# Patient Record
Sex: Female | Born: 1935 | Race: White | Hispanic: No | Marital: Married | Smoking: Never smoker
Health system: Southern US, Community
[De-identification: ages and names within clinical notes are randomized; demographics above are authoritative.]

## PROBLEM LIST (undated history)

## (undated) DIAGNOSIS — C801 Malignant (primary) neoplasm, unspecified: Secondary | ICD-10-CM

## (undated) DIAGNOSIS — R000 Tachycardia, unspecified: Secondary | ICD-10-CM

## (undated) HISTORY — PX: CHOLECYSTECTOMY: SHX55

## (undated) HISTORY — PX: ABDOMINAL HYSTERECTOMY: SHX81

## (undated) HISTORY — PX: BREAST SURGERY: SHX581

---

## 1998-02-11 ENCOUNTER — Ambulatory Visit (HOSPITAL_COMMUNITY): Admission: RE | Admit: 1998-02-11 | Discharge: 1998-02-11 | Payer: Self-pay | Admitting: Internal Medicine

## 2005-05-30 ENCOUNTER — Ambulatory Visit: Payer: Self-pay

## 2005-08-18 ENCOUNTER — Ambulatory Visit: Payer: Self-pay | Admitting: Unknown Physician Specialty

## 2005-12-20 ENCOUNTER — Ambulatory Visit: Payer: Self-pay | Admitting: Otolaryngology

## 2007-12-30 ENCOUNTER — Ambulatory Visit: Payer: Self-pay | Admitting: Podiatry

## 2008-01-09 ENCOUNTER — Other Ambulatory Visit: Payer: Self-pay

## 2008-01-09 ENCOUNTER — Ambulatory Visit: Payer: Self-pay | Admitting: Podiatry

## 2008-01-15 ENCOUNTER — Ambulatory Visit: Payer: Self-pay | Admitting: Podiatry

## 2008-05-20 ENCOUNTER — Ambulatory Visit: Payer: Self-pay | Admitting: Unknown Physician Specialty

## 2009-09-20 ENCOUNTER — Emergency Department: Payer: Self-pay | Admitting: Emergency Medicine

## 2009-10-25 ENCOUNTER — Ambulatory Visit: Payer: Self-pay | Admitting: Gastroenterology

## 2011-04-26 DIAGNOSIS — F419 Anxiety disorder, unspecified: Secondary | ICD-10-CM | POA: Diagnosis present

## 2011-04-26 DIAGNOSIS — E039 Hypothyroidism, unspecified: Secondary | ICD-10-CM | POA: Diagnosis present

## 2011-04-26 DIAGNOSIS — K219 Gastro-esophageal reflux disease without esophagitis: Secondary | ICD-10-CM | POA: Diagnosis present

## 2012-04-03 ENCOUNTER — Ambulatory Visit: Payer: Self-pay | Admitting: Pain Medicine

## 2012-04-17 ENCOUNTER — Ambulatory Visit: Payer: Self-pay | Admitting: Pain Medicine

## 2012-05-06 ENCOUNTER — Ambulatory Visit: Payer: Self-pay | Admitting: Pain Medicine

## 2013-03-19 ENCOUNTER — Ambulatory Visit: Payer: Self-pay | Admitting: Family Medicine

## 2014-04-30 ENCOUNTER — Ambulatory Visit: Payer: Self-pay | Admitting: Otolaryngology

## 2014-06-05 ENCOUNTER — Emergency Department: Payer: Self-pay | Admitting: Emergency Medicine

## 2014-06-05 LAB — COMPREHENSIVE METABOLIC PANEL
ALBUMIN: 3.4 g/dL (ref 3.4–5.0)
ANION GAP: 5 — AB (ref 7–16)
AST: 27 U/L (ref 15–37)
Alkaline Phosphatase: 77 U/L
BUN: 12 mg/dL (ref 7–18)
Bilirubin,Total: 0.6 mg/dL (ref 0.2–1.0)
CHLORIDE: 107 mmol/L (ref 98–107)
CO2: 28 mmol/L (ref 21–32)
Calcium, Total: 8.5 mg/dL (ref 8.5–10.1)
Creatinine: 0.97 mg/dL (ref 0.60–1.30)
EGFR (African American): >60
GFR CALC NON AF AMER: 59 — AB
GLUCOSE: 108 mg/dL — AB (ref 65–99)
OSMOLALITY: 280 (ref 275–301)
Potassium: 4.6 mmol/L (ref 3.5–5.1)
SGPT (ALT): 18 U/L
SODIUM: 140 mmol/L (ref 136–145)
Total Protein: 6.5 g/dL (ref 6.4–8.2)

## 2014-06-05 LAB — CBC
HCT: 41.6 % (ref 35.0–47.0)
HGB: 13.6 g/dL (ref 12.0–16.0)
MCH: 30.8 pg (ref 26.0–34.0)
MCHC: 32.7 g/dL (ref 32.0–36.0)
MCV: 94 fL (ref 80–100)
PLATELETS: 250 10*3/uL (ref 150–440)
RBC: 4.42 10*6/uL (ref 3.80–5.20)
RDW: 12.9 % (ref 11.5–14.5)
WBC: 8.7 10*3/uL (ref 3.6–11.0)

## 2014-06-05 LAB — TROPONIN I

## 2014-06-29 ENCOUNTER — Ambulatory Visit: Payer: Self-pay | Admitting: Obstetrics and Gynecology

## 2017-08-27 ENCOUNTER — Encounter: Payer: Self-pay | Admitting: *Deleted

## 2017-08-27 ENCOUNTER — Ambulatory Visit
Admission: EM | Admit: 2017-08-27 | Discharge: 2017-08-27 | Disposition: A | Discharge status: Home / Self Care | Payer: Medicare HMO | Attending: Family Medicine | Admitting: Family Medicine

## 2017-08-27 DIAGNOSIS — H8149 Vertigo of central origin, unspecified ear: Secondary | ICD-10-CM | POA: Diagnosis not present

## 2017-08-27 DIAGNOSIS — J0101 Acute recurrent maxillary sinusitis: Secondary | ICD-10-CM

## 2017-08-27 DIAGNOSIS — R42 Dizziness and giddiness: Secondary | ICD-10-CM

## 2017-08-27 DIAGNOSIS — H65193 Other acute nonsuppurative otitis media, bilateral: Secondary | ICD-10-CM | POA: Diagnosis not present

## 2017-08-27 HISTORY — DX: Tachycardia, unspecified: R00.0

## 2017-08-27 HISTORY — DX: Malignant (primary) neoplasm, unspecified: C80.1

## 2017-08-27 MED ORDER — AMOXICILLIN-POT CLAVULANATE 875-125 MG PO TABS: 1.0000 | ORAL_TABLET | Freq: Two times a day (BID) | ORAL | 0 refills | Status: DC

## 2017-08-27 MED ORDER — ONDANSETRON 4 MG PO TBDP: 4.0000 mg | ORAL_TABLET | Freq: Three times a day (TID) | ORAL | 0 refills | Status: DC | PRN

## 2017-08-27 NOTE — ED Provider Notes (Signed)
[facility] ____________________________________________  Time seen: Approximately 11:04 AM  I have reviewed the triage vital signs and the nursing notes.   HISTORY  Chief Complaint Otalgia; Dizziness; Facial Pain; and Headache  HPI Judy Jenkins is a 83 y.o. female past medical history including recurrent sinusitis, multiple previous sinus surgeries, vertigo, breast cancer presenting with husband at bedside for evaluation of 1 week of nasal congestion, nasal drainage and intermittent vertigo over the last few days.  Patient reports has a history of vertigo, with worse occurring several years ago, states current vertigo is only present with position changes, and reports is mild compared to past vertigo episodes.  States has taken home meclizine this morning which has helped and she is feeling better in regards to intermittent dizziness sensation and her vertigo how she describes as room spinning for a few seconds after movements.  States that if she is sitting still she feels fine.  Denies issues when upright and ambulating.  Patient states that she has had increased nasal drainage, stating postnasal drainage that is thick yellow.  States that she uses a pulsating nasal irrigation twice a day and having copious greenish yellow drainage.  States increased sinus pressure, worse to left maxillary sinus area.  States that every 2-3 months this recurs.  States sometimes she is able to not take antibiotics and symptoms resolved.  States last episode was in mid November in which she took 2-3 days of minocycline which made her sick on her stomach, so she stopped.  States last week for 4 days she also took minocycline again and states that she has had more nausea with it.  No vomiting or diarrhea.  Continues with normal bowel movements.  States that she is very sensitive to almost all antibiotics.  Allergic only to codeine and sulfa.  States most antibiotics make her sick on her stomach.   Reports continues to eat and drink.  Denies associated fevers.  Denies unilateral weakness, syncope or near syncope, vision changes, weakness, vomiting or diarrhea.  Denies previous CVA, renal insufficiency or diabetes.  Denies chest pain, shortness of breath, abdominal pain, dysuria, extremity pain, extremity swelling or rash. Denies recent sickness. Denies recent antibiotic use.  States normally follows with Dr. Ladene Artist.  Also has a PCP appointment for this Friday for similar complaints.  Hortencia Pilar, MD: PCP   Past Medical History:  Diagnosis Date  . Cancer ([place])   . Tachycardia   GERD Breast Cancer Sinusitis  There are no active problems to display for this patient.   Past Surgical History:  Procedure Laterality Date  . ABDOMINAL HYSTERECTOMY    . BREAST SURGERY    . CHOLECYSTECTOMY    multiple sinus surgeries    No current facility-administered medications for this encounter.   Current Outpatient Medications:  .  ALPRAZolam (XANAX) 0.25 MG tablet, Take 0.25 mg by mouth at bedtime as needed for anxiety., Disp: , Rfl:  .  amitriptyline (ELAVIL) 10 MG tablet, Take 10 mg by mouth at bedtime., Disp: , Rfl:  .  esomeprazole (NEXIUM) 20 MG capsule, Take 20 mg by mouth daily at 12 noon., Disp: , Rfl:  .  levothyroxine (SYNTHROID, LEVOTHROID) 50 MCG tablet, Take 50 mcg by mouth daily before breakfast., Disp: , Rfl:  .  meclizine (ANTIVERT) 25 MG tablet, Take 25 mg by mouth 3 (three) times daily as needed for dizziness., Disp: , Rfl:  .  metoprolol succinate (TOPROL-XL) 25 MG 24 hr tablet, Take 25 mg by mouth  daily., Disp: , Rfl:  .  simvastatin (ZOCOR) 20 MG tablet, Take 20 mg by mouth daily., Disp: , Rfl:  .  amoxicillin-clavulanate (AUGMENTIN) 875-125 MG tablet, Take 1 tablet by mouth every 12 (twelve) hours., Disp: 20 tablet, Rfl: 0 .  ondansetron (ZOFRAN ODT) 4 MG disintegrating tablet, Take 1 tablet (4 mg total) by mouth every 8 (eight) hours as needed for nausea., Disp: 15  tablet, Rfl: 0  Allergies Codeine and Sulfa antibiotics  Family History  Problem Relation Age of Onset  . CAD Mother   . CAD Father     Social History Social History   Tobacco Use  . Smoking status: Never Smoker  . Smokeless tobacco: Never Used  Substance Use Topics  . Alcohol use: No    Frequency: Never  . Drug use: No    Review of Systems Constitutional: No fever/chills Eyes: No visual changes. ENT: No sore throat. Cardiovascular: Denies chest pain. Respiratory: Denies shortness of breath. Gastrointestinal: No abdominal pain. No vomiting.  No diarrhea.  No constipation. Musculoskeletal: Negative for back pain. Skin: Negative for rash. Neurological: Negative for focal weakness or numbness.   ____________________________________________   PHYSICAL EXAM:  VITAL SIGNS: ED Triage Vitals  Enc Vitals Group     BP 08/27/17 1039 129/61     Pulse Rate 08/27/17 1039 89     Resp 08/27/17 1039 16     Temp 08/27/17 1039 97.6 F (36.4 C)     Temp Source 08/27/17 1039 Oral     SpO2 08/27/17 1039 98 %     Weight 08/27/17 1041 130 lb (59 kg)     Height 08/27/17 1041 5\' 7"  (1.702 m)     Head Circumference --      Peak Flow --      Pain Score 08/27/17 1041 6     Pain Loc --      Pain Edu? --      Excl. in [place]? --     Constitutional: Alert and oriented. Well appearing and in no acute distress. Eyes: Conjunctivae are normal. PERRL. EOMI. Head: Atraumatic.Mild tenderness to palpation bilateral frontal and moderate tenderness to palpation left maxillary sinus, no right tenderness. No swelling. No erythema.   Ears: nontender, normal canal, no erythema, mild effusion bilaterally, normal TMs bilaterally.   Nose: nasal congestion with bilateral nasal turbinate erythema and edema.   Mouth/Throat: Mucous membranes are moist.  Oropharynx non-erythematous.No tonsillar swelling or exudate.  Neck: No stridor.  No cervical spine tenderness to  palpation. Hematological/Lymphatic/Immunilogical: No cervical lymphadenopathy. Cardiovascular: Normal rate, regular rhythm. Grossly normal heart sounds.  Good peripheral circulation. Respiratory: Normal respiratory effort.  No retractions. No wheezes, rales or rhonchi. Good air movement.  Musculoskeletal: No lower or upper extremity tenderness nor edema.  No cervical, thoracic or lumbar tenderness to palpation.  Neurologic:  Normal speech and language. No gross focal neurologic deficits are appreciated. No gait instability. Negative pronator drift. Steady gait. No paresthesias. 5/5 strength to bilateral upper and lower extremities.  Skin:  Skin is warm, dry and intact. No rash noted. Psychiatric: Mood and affect are normal. Speech and behavior are normal.  ___________________________________________   LABS (all labs ordered are listed, but only abnormal results are displayed)  Labs Reviewed - No data to display  RADIOLOGY  No results found. ____________________________________________   PROCEDURES Procedures   INITIAL IMPRESSION / ASSESSMENT AND PLAN / ED COURSE  Pertinent labs & imaging results that were available during my care of the patient were  reviewed by me and considered in my medical decision making (see chart for details).  Well-appearing patient.  No focal neurological deficits.  Suspect recurrent maxillary sinusitis, noted bilateral otitis effusion, as well as with secondary vertigo.  Discussed no clear indication for laboratory studies at this time, patient agrees. Patient with history of nausea second to most antibiotics, but reports she has not taken any nausea medicine to avoid this in the past.  Discussed with patient will start patient on oral Zofran 30 minutes prior to  taking antibiotic to take antibiotic with food.  Patient has tried a few days worth of minocycline, no other antibiotic use.  Will start patient on oral Zofran and oral Augmentin.  Encourage rest,  fluids, continuing home nasal rinses.  Encourage PCP reevaluation in 2-3 days.  Discussed prompt reevaluation for any worsening vertigo or dizziness, or worsening symptoms.Procedure explained and verbal consent obtained. Discussed indication, risks and benefits of medications with patient.  Discussed follow up with Primary care physician this week as scheduled. Discussed follow up and return parameters including no resolution or any worsening concerns. Patient verbalized understanding and agreed to plan.   ____________________________________________   FINAL CLINICAL IMPRESSION(S) / ED DIAGNOSES  Final diagnoses:  Acute recurrent maxillary sinusitis  Acute effusion of both middle ears  Vertigo     ED Discharge Orders        Ordered    ondansetron (ZOFRAN ODT) 4 MG disintegrating tablet  Every 8 hours PRN     08/27/17 1126    amoxicillin-clavulanate (AUGMENTIN) 875-125 MG tablet  Every 12 hours     08/27/17 1126       Note: This dictation was prepared with Dragon dictation along with smaller phrase technology. Any transcriptional errors that result from this process are unintentional.         Marylene Land, NP 08/27/17 1143

## 2017-08-27 NOTE — Discharge Instructions (Signed)
Take medication as prescribed. Nausea medication 30 minutes before antibiotic. Rest. Drink plenty of fluids.   Follow up with your primary care physician this week as scheduled. Return to Urgent care for new or worsening concerns.

## 2017-08-27 NOTE — ED Triage Notes (Signed)
Dizziness, facial pain, headache, right ear pain, x several weeks. Has been seen at another urgent care and rx Minocycline but pt poorly tolerated, this was 4 weeks ago.

## 2018-02-26 ENCOUNTER — Emergency Department: Payer: Medicare HMO

## 2018-02-26 ENCOUNTER — Encounter: Payer: Self-pay | Admitting: Emergency Medicine

## 2018-02-26 ENCOUNTER — Emergency Department
Admission: EM | Admit: 2018-02-26 | Discharge: 2018-02-26 | Disposition: A | Discharge status: Home / Self Care | Payer: Medicare HMO | Attending: Emergency Medicine | Admitting: Emergency Medicine

## 2018-02-26 DIAGNOSIS — Y92017 Garden or yard in single-family (private) house as the place of occurrence of the external cause: Secondary | ICD-10-CM | POA: Insufficient documentation

## 2018-02-26 DIAGNOSIS — Y998 Other external cause status: Secondary | ICD-10-CM | POA: Insufficient documentation

## 2018-02-26 DIAGNOSIS — T148XXA Other injury of unspecified body region, initial encounter: Secondary | ICD-10-CM

## 2018-02-26 DIAGNOSIS — W01198A Fall on same level from slipping, tripping and stumbling with subsequent striking against other object, initial encounter: Secondary | ICD-10-CM | POA: Diagnosis not present

## 2018-02-26 DIAGNOSIS — S301XXA Contusion of abdominal wall, initial encounter: Secondary | ICD-10-CM | POA: Diagnosis not present

## 2018-02-26 DIAGNOSIS — S0001XA Abrasion of scalp, initial encounter: Secondary | ICD-10-CM | POA: Diagnosis not present

## 2018-02-26 DIAGNOSIS — Z859 Personal history of malignant neoplasm, unspecified: Secondary | ICD-10-CM | POA: Insufficient documentation

## 2018-02-26 DIAGNOSIS — Z79899 Other long term (current) drug therapy: Secondary | ICD-10-CM | POA: Diagnosis not present

## 2018-02-26 DIAGNOSIS — S3991XA Unspecified injury of abdomen, initial encounter: Secondary | ICD-10-CM | POA: Diagnosis present

## 2018-02-26 DIAGNOSIS — Y93H2 Activity, gardening and landscaping: Secondary | ICD-10-CM | POA: Diagnosis not present

## 2018-02-26 DIAGNOSIS — E875 Hyperkalemia: Secondary | ICD-10-CM

## 2018-02-26 LAB — COMPREHENSIVE METABOLIC PANEL
ALK PHOS: 82 U/L (ref 38–126)
ALT: 13 U/L (ref 0–44)
ALT: 12 U/L (ref 0–44)
ANION GAP: 7 (ref 5–15)
AST: 22 U/L (ref 15–41)
AST: 21 U/L (ref 15–41)
Albumin: 4 g/dL (ref 3.5–5.0)
Albumin: 4.1 g/dL (ref 3.5–5.0)
Alkaline Phosphatase: 77 U/L (ref 38–126)
Anion gap: 6 (ref 5–15)
BILIRUBIN TOTAL: 0.9 mg/dL (ref 0.3–1.2)
BUN: 14 mg/dL (ref 8–23)
BUN: 15 mg/dL (ref 8–23)
CALCIUM: 9.3 mg/dL (ref 8.9–10.3)
CHLORIDE: 105 mmol/L (ref 98–111)
CO2: 27 mmol/L (ref 22–32)
CO2: 26 mmol/L (ref 22–32)
CREATININE: 0.96 mg/dL (ref 0.44–1.00)
Calcium: 9.3 mg/dL (ref 8.9–10.3)
Chloride: 105 mmol/L (ref 98–111)
Creatinine, Ser: 0.96 mg/dL (ref 0.44–1.00)
GFR calc Af Amer: >60 mL/min (ref >60)
GFR calc non Af Amer: 54 mL/min — ABNORMAL LOW (ref >60)
GFR, EST NON AFRICAN AMERICAN: 54 mL/min — AB (ref >60)
Glucose, Bld: 113 mg/dL — ABNORMAL HIGH (ref 70–99)
Glucose, Bld: 114 mg/dL — ABNORMAL HIGH (ref 70–99)
Potassium: 5.6 mmol/L — ABNORMAL HIGH (ref 3.5–5.1)
Potassium: 5.2 mmol/L — ABNORMAL HIGH (ref 3.5–5.1)
SODIUM: 138 mmol/L (ref 135–145)
Sodium: 138 mmol/L (ref 135–145)
TOTAL PROTEIN: 7.1 g/dL (ref 6.5–8.1)
Total Bilirubin: 0.8 mg/dL (ref 0.3–1.2)
Total Protein: 7 g/dL (ref 6.5–8.1)

## 2018-02-26 LAB — CBC
HCT: 37.5 % (ref 35.0–47.0)
Hemoglobin: 13.1 g/dL (ref 12.0–16.0)
MCH: 31.3 pg (ref 26.0–34.0)
MCHC: 34.9 g/dL (ref 32.0–36.0)
MCV: 89.7 fL (ref 80.0–100.0)
PLATELETS: 280 10*3/uL (ref 150–440)
RBC: 4.18 MIL/uL (ref 3.80–5.20)
RDW: 12.6 % (ref 11.5–14.5)
WBC: 8.6 10*3/uL (ref 3.6–11.0)

## 2018-02-26 MED ORDER — IOHEXOL 300 MG/ML  SOLN
100.0000 mL | Freq: Once | INTRAMUSCULAR | Status: AC | PRN
  Administered 2018-02-26: 100 mL via INTRAVENOUS
  Filled 2018-02-26: qty 100

## 2018-02-26 MED ORDER — PATIROMER SORBITEX CALCIUM 8.4 G PO PACK
8.4000 g | PACK | Freq: Once | ORAL | Status: AC
  Administered 2018-02-26: 8.4 g via ORAL
  Filled 2018-02-26: qty 1

## 2018-02-26 MED ORDER — SODIUM CHLORIDE 0.9 % IV BOLUS: 1000.0000 mL | Freq: Once | INTRAVENOUS | Status: DC

## 2018-02-26 NOTE — ED Triage Notes (Signed)
Pt sent from MD office to rule out a liver injury. Pt fell this am working in her garden. She tripped over a stick and landed on a rock. Pt with c/o pain to right ribs and RUQ. Denies hitting head or LOC.

## 2018-02-26 NOTE — ED Notes (Signed)
Patient transported to CT 

## 2018-02-26 NOTE — ED Provider Notes (Addendum)
[facility] Emergency Department Provider Note ____________________________________________   First MD Initiated Contact with Patient 02/26/18 1407     (approximate)  I have reviewed the triage vital signs and the nursing notes.   HISTORY  Chief Complaint Fall; Rib Injury; and Abdominal Injury  HPI Judy Jenkins is a 83 y.o. female on 81 mg of aspirin who was presented to the emergency department today with a blunt injury to her right upper quadrant of the abdomen.  She states that she was in her garden this morning when she tripped over a rock at about 10:30 AM.  Says that she fell into another rock to her right flank.  She was seen at her primary care doctor's office, who then sent her to the emergency department for further evaluation and treatment out of concern for a liver laceration.  The patient denies hitting her head against the ground or losing consciousness.  However, she does state that her head went into a bush and that she sustained a scratch to her occiput.  Says that the pain is worse when she moves to the right flank but does not request pain medicine here in the er.  Patient states her last tetanus shot was in 2012 and would like to have it updated in her primary care doctor's office.  Also sustained a skin tear to the right forearm.    Past Medical History:  Diagnosis Date  . Cancer ([place])   . Tachycardia     There are no active problems to display for this patient.   Past Surgical History:  Procedure Laterality Date  . ABDOMINAL HYSTERECTOMY    . BREAST SURGERY    . CHOLECYSTECTOMY      Prior to Admission medications   Medication Sig Start Date End Date Taking? Authorizing Provider  ALPRAZolam Duanne Moron) 0.25 MG tablet Take 0.25 mg by mouth at bedtime as needed for anxiety.    [provider]  amitriptyline (ELAVIL) 10 MG tablet Take 10 mg by mouth at bedtime.    [provider]  amoxicillin-clavulanate  (AUGMENTIN) 875-125 MG tablet Take 1 tablet by mouth every 12 (twelve) hours. 08/27/17   Marylene Land, NP  esomeprazole (NEXIUM) 20 MG capsule Take 20 mg by mouth daily at 12 noon.    [provider]  levothyroxine (SYNTHROID, LEVOTHROID) 50 MCG tablet Take 50 mcg by mouth daily before breakfast.    [provider]  meclizine (ANTIVERT) 25 MG tablet Take 25 mg by mouth 3 (three) times daily as needed for dizziness.    [provider]  metoprolol succinate (TOPROL-XL) 25 MG 24 hr tablet Take 25 mg by mouth daily.    [provider]  ondansetron (ZOFRAN ODT) 4 MG disintegrating tablet Take 1 tablet (4 mg total) by mouth every 8 (eight) hours as needed for nausea. 08/27/17   Marylene Land, NP  simvastatin (ZOCOR) 20 MG tablet Take 20 mg by mouth daily.    [provider]    Allergies Avelox [moxifloxacin hcl in nacl]; Ciprofloxacin; Biaxin [clarithromycin]; Atorvastatin; Codeine; and Sulfa antibiotics  Family History  Problem Relation Age of Onset  . CAD Mother   . CAD Father     Social History Social History   Tobacco Use  . Smoking status: Never Smoker  . Smokeless tobacco: Never Used  Substance Use Topics  . Alcohol use: No    Frequency: Never  . Drug use: No    Review of Systems  Constitutional: No  fever/chills Eyes: No visual changes. ENT: No sore throat. Cardiovascular: Denies chest pain. Respiratory: Denies shortness of breath. Gastrointestinal:  No nausea, no vomiting.  No diarrhea.  No constipation. Genitourinary: Negative for dysuria. Musculoskeletal: Negative for back pain. Skin: Negative for rash. Neurological: Negative for headaches, focal weakness or numbness.   ____________________________________________   PHYSICAL EXAM:  VITAL SIGNS: ED Triage Vitals  Enc Vitals Group     BP 02/26/18 1325 (!) 146/63     Pulse Rate 02/26/18 1325 69     Resp 02/26/18 1325 18     Temp 02/26/18 1325 98.3 F (36.8 C)      Temp Source 02/26/18 1325 Oral     SpO2 02/26/18 1325 100 %     Weight 02/26/18 1318 134 lb (60.8 kg)     Height 02/26/18 1318 5\' 7"  (1.702 m)     Head Circumference --      Peak Flow --      Pain Score 02/26/18 1318 5     Pain Loc --      Pain Edu? --      Excl. in [place]? --     Constitutional: Alert and oriented. Well appearing and in no acute distress. Eyes: Conjunctivae are normal.  Head: Very small abrasion to the superior occiput without any associated hematoma, depression or tenderness.  No active bleeding. Nose: No congestion/rhinnorhea. Mouth/Throat: Mucous membranes are moist.  Neck: No stridor.  No cervical spine tenderness nor deformity or step-off.  Ranges head and neck freely. Cardiovascular: Normal rate, regular rhythm. Grossly normal heart sounds.   Respiratory: Normal respiratory effort.  No retractions. Lungs CTAB. Gastrointestinal: Soft with moderate to severe right upper quadrant as well as right flank tenderness to palpation without any crepitus, ecchymosis, rebound. No distention. No CVA tenderness.  No deformity visualized. Musculoskeletal: No lower extremity tenderness nor edema.  No joint effusions.  No tenderness to palpation over the upper thoracic wall nor does there appear to be any rib point tenderness.  Right lower thoracic wall and right upper quadrant as documented in the abdominal exam above.  No crepitus.  No ecchymosis.  Negative tenderness to palpation to the thoracic nor lumbar spines.  No deformity or step-off. Neurologic:  Normal speech and language. No gross focal neurologic deficits are appreciated. Skin:    3 cm skin tear to the right, lateral forearm, that is superficial and without any active bleeding.  It is in a semi-jagged but mostly linear orientation.  No surrounding erythema.  Psychiatric: Mood and affect are normal. Speech and behavior are normal.  ____________________________________________   LABS (all labs ordered are listed, but  only abnormal results are displayed)  Labs Reviewed  COMPREHENSIVE METABOLIC PANEL - Abnormal; Notable for the following components:      Result Value   Potassium 5.6 (*)    Glucose, Bld 113 (*)    GFR calc non Af Amer 54 (*)    All other components within normal limits  COMPREHENSIVE METABOLIC PANEL - Abnormal; Notable for the following components:   Potassium 5.2 (*)    Glucose, Bld 114 (*)    GFR calc non Af Amer 54 (*)    All other components within normal limits  CBC   ____________________________________________  EKG  ED ECG REPORT I, Doran Stabler, the attending physician, personally viewed and interpreted this ECG.   Date: 02/26/2018  EKG Time: 1329  Rate: 62  Rhythm: normal sinus rhythm  Axis: Normal  Intervals:none  ST&T Change:  No ST segment elevation or depression.  No abnormal T wave inversion.  ____________________________________________  RADIOLOGY  No acute finding on the chest x-ray nor the abdomen and pelvis CAT scan. ____________________________________________   PROCEDURES  Procedure(s) performed:   Procedures  Critical Care performed:   ____________________________________________   INITIAL IMPRESSION / ASSESSMENT AND PLAN / ED COURSE  Pertinent labs & imaging results that were available during my care of the patient were reviewed by me and considered in my medical decision making (see chart for details).  Differential diagnosis includes, but is not limited to, biliary disease (biliary colic, acute cholecystitis, cholangitis, choledocholithiasis, etc), intrathoracic causes for epigastric abdominal pain including ACS, gastritis, duodenitis, pancreatitis, small bowel or large bowel obstruction, abdominal aortic aneurysm, hernia, and ulcer(s),  Liver laceration, abdominal wall pain, contusion, rib fracture As part of my medical decision making, I reviewed the following data within the electronic MEDICAL RECORD NUMBER Notes from prior ED  visits  ----------------------------------------- 4:03 PM on 02/26/2018 -----------------------------------------  Patient's repeat potassium is at 5.2.  She states that she takes potassium supplementation with Lasix at home.  Says the last time she took the potassium was about a week ago and says that she is only taken it twice over the course of a month.  Unclear cause of the increased potassium.  We will give her a dose of Veltassa.  She knows she will have to follow-up in 48 hours for repeat blood draw with her primary care doctor.  We discussed the chest x-ray as well as CAT scan results.  Patient as well as family understanding and willing to comply.  Patient without any significant impact to the head and neck.  I do not believe that she requires imaging at this time. ____________________________________________   FINAL CLINICAL IMPRESSION(S) / ED DIAGNOSES  Hyperkalemia.  Flank contusion.  Skin tear.  Abrasion to scalp.      NEW MEDICATIONS STARTED DURING THIS VISIT:  New Prescriptions   No medications on file     Note:  This document was prepared using Dragon voice recognition software and may include unintentional dictation errors.     Orbie Pyo, MD 02/26/18 1605    Clearnce Hasten, Randall An, MD 02/26/18 [id number]

## 2018-06-10 ENCOUNTER — Ambulatory Visit: Payer: Medicare HMO | Admitting: Physician Assistant

## 2018-10-09 ENCOUNTER — Other Ambulatory Visit: Payer: Self-pay | Admitting: Family Medicine

## 2018-10-09 DIAGNOSIS — E039 Hypothyroidism, unspecified: Secondary | ICD-10-CM

## 2018-10-17 ENCOUNTER — Ambulatory Visit: Payer: Medicare HMO

## 2018-11-17 ENCOUNTER — Other Ambulatory Visit: Payer: Self-pay

## 2018-11-17 ENCOUNTER — Emergency Department: Payer: Medicare HMO

## 2018-11-17 ENCOUNTER — Encounter: Payer: Self-pay | Admitting: Emergency Medicine

## 2018-11-17 ENCOUNTER — Emergency Department
Admission: EM | Admit: 2018-11-17 | Discharge: 2018-11-17 | Disposition: A | Discharge status: Home / Self Care | Payer: Medicare HMO | Attending: Emergency Medicine | Admitting: Emergency Medicine

## 2018-11-17 DIAGNOSIS — R51 Headache: Secondary | ICD-10-CM | POA: Insufficient documentation

## 2018-11-17 DIAGNOSIS — Z79899 Other long term (current) drug therapy: Secondary | ICD-10-CM | POA: Insufficient documentation

## 2018-11-17 DIAGNOSIS — J3489 Other specified disorders of nose and nasal sinuses: Secondary | ICD-10-CM

## 2018-11-17 DIAGNOSIS — R509 Fever, unspecified: Secondary | ICD-10-CM | POA: Diagnosis present

## 2018-11-17 NOTE — Discharge Instructions (Signed)
Follow-up with ENT (Dr. Kathyrn Sheriff)  Call your doctor or return to the ED if you have a worsening headache or sinus pain, sudden and severe headache, confusion, slurred speech, facial droop, weakness or numbness in any arm or leg, extreme fatigue, vision problems, or other symptoms that concern you.

## 2018-11-17 NOTE — ED Provider Notes (Signed)
[facility] Emergency Department Provider Note   ____________________________________________   First MD Initiated Contact with Patient 11/17/18 1041     (approximate)  I have reviewed the triage vital signs and the nursing notes.   HISTORY  Chief Complaint Fever; Headache; and Facial Pain    HPI Judy Jenkins is a 84 y.o. female   presents for evaluation of for pain over her left and frontal sinuses  Patient reports for about a week she has had sinus slight congestion and pain behind her nose and over the left sinus pointing at her maxillary sinus.  She reports multiple sinus surgeries in the past.  She felt like she had fever earlier in the week which is since gone away.  Her primary doctor put her on cephalexin but she reports all antibiotics caused her to feel nauseated so she since discontinued that.  Nausea is improved.  No body aches.  Reports a bit of a headache but it is right behind the left sinus region.  No eye pain.  No visual changes  No abdominal pain no nausea vomiting.  No exposure to anyone with coronavirus and she is been sheltering at home carefully for 2 weeks time.  She does ask if the symptoms could be consistent with coronavirus,  Past Medical History:  Diagnosis Date  . Cancer ([place])   . Tachycardia     There are no active problems to display for this patient.   Past Surgical History:  Procedure Laterality Date  . ABDOMINAL HYSTERECTOMY    . BREAST SURGERY    . CHOLECYSTECTOMY      Prior to Admission medications   Medication Sig Start Date End Date Taking? Authorizing Provider  ALPRAZolam Duanne Moron) 0.25 MG tablet Take 0.25 mg by mouth at bedtime as needed for anxiety.    [provider]  amitriptyline (ELAVIL) 10 MG tablet Take 10 mg by mouth at bedtime.    [provider]  amoxicillin-clavulanate (AUGMENTIN) 875-125 MG tablet Take 1 tablet by mouth every 12 (twelve) hours. Patient not taking:  Reported on 11/17/2018 08/27/17   Marylene Land, NP  esomeprazole (NEXIUM) 20 MG capsule Take 20 mg by mouth daily at 12 noon.    [provider]  levothyroxine (SYNTHROID, LEVOTHROID) 50 MCG tablet Take 50 mcg by mouth daily before breakfast.    [provider]  meclizine (ANTIVERT) 25 MG tablet Take 25 mg by mouth 3 (three) times daily as needed for dizziness.    [provider]  metoprolol succinate (TOPROL-XL) 25 MG 24 hr tablet Take 25 mg by mouth daily.    [provider]  ondansetron (ZOFRAN ODT) 4 MG disintegrating tablet Take 1 tablet (4 mg total) by mouth every 8 (eight) hours as needed for nausea. 08/27/17   Marylene Land, NP  simvastatin (ZOCOR) 20 MG tablet Take 20 mg by mouth daily.    [provider]    Allergies Avelox [moxifloxacin hcl in nacl]; Ciprofloxacin; Biaxin [clarithromycin]; Atorvastatin; Codeine; and Sulfa antibiotics  Family History  Problem Relation Age of Onset  . CAD Mother   . CAD Father     Social History Social History   Tobacco Use  . Smoking status: Never Smoker  . Smokeless tobacco: Never Used  Substance Use Topics  . Alcohol use: No    Frequency: Never  . Drug use: No    Review of Systems Constitutional: No fever/chills except earlier in the week she felt like she was having some  fevers Eyes: No visual changes. ENT: Slightly scratchy throat.  No ear pain.  Denies dental pain. Cardiovascular: Denies chest pain. Respiratory: Denies shortness of breath.  Denies cough, triage note reports she had a cough.  Patient reports just a little bit of a scratchy throat but she feels like in the morning she will have a little bit of phlegm when she just gets but not coughing throughout the day and not short of breath. Gastrointestinal: No abdominal pain.  Had some nausea when taking cephalexin, but has since discontinued it which her doctor put her on thinking she might have a sinus infection Genitourinary:  Negative for dysuria. Musculoskeletal: Negative for back pain. Skin: Negative for rash. Neurological: Negative for headaches except she describes as "sinus pain", areas of focal weakness or numbness.    ____________________________________________   PHYSICAL EXAM:  VITAL SIGNS: ED Triage Vitals  Enc Vitals Group     BP 11/17/18 1028 132/74     Pulse Rate 11/17/18 1028 75     Resp 11/17/18 1028 18     Temp 11/17/18 1028 98.4 F (36.9 C)     Temp Source 11/17/18 1028 Oral     SpO2 11/17/18 1028 99 %     Weight 11/17/18 1020 135 lb (61.2 kg)     Height 11/17/18 1020 5\' 7"  (1.702 m)     Head Circumference --      Peak Flow --      Pain Score 11/17/18 1020 5     Pain Loc --      Pain Edu? --      Excl. in [place]? --     Constitutional: Alert and oriented. Well appearing and in no acute distress.  She is very pleasant and in no distress.  Reports minimal discomfort over the left frontal sinus. Eyes: Conjunctivae are normal. Head: Atraumatic. Nose: Some slight sinus congestion, pain over the left sinus and also right behind her nose. Mouth/Throat: Mucous membranes are moist. Neck: No stridor.  Some tenderness over the frontal sinuses without swelling or edema.  Left maxillary sinus nontender.  Dentures, no dental lesions noted.  Posterior oropharynx is clear. Cardiovascular: Normal rate, regular rhythm. Grossly normal heart sounds.  Good peripheral circulation. Respiratory: Normal respiratory effort.  No retractions. Lungs CTAB. Gastrointestinal: Soft and nontender. No distention. Musculoskeletal: No lower extremity tenderness nor edema. Neurologic:  Normal speech and language. No gross focal neurologic deficits are appreciated.  Skin:  Skin is warm, dry and intact. No rash noted. Psychiatric: Mood and affect are normal. Speech and behavior are normal.  ____________________________________________   LABS (all labs ordered are listed, but only abnormal results are displayed)   Labs Reviewed - No data to display ____________________________________________  EKG   ____________________________________________  RADIOLOGY  Ct Maxillofacial Wo Contrast  Result Date: 11/17/2018 CLINICAL DATA:  Headache. LEFT facial pain. EXAM: CT MAXILLOFACIAL WITHOUT CONTRAST TECHNIQUE: Multidetector CT imaging of the maxillofacial structures was performed. Multiplanar CT image reconstructions were also generated. COMPARISON:  None. FINDINGS: Osseous: No fracture or mandibular dislocation. No destructive process. Orbits: Negative. No traumatic or inflammatory finding. Sinuses: Previous BILATERAL maxillary sinus and ethmoid sinus surgery. 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 No significant sinus opacity or layering fluid. Nasal passages are widely patent. Nasal septum is midline. Soft tissues: Unremarkable soft tissues over the LEFT face. Carotid bifurcation atherosclerosis is incidentally noted. Airway is patent and midline. Limited intracranial: Atrophy without significant or unexpected. IMPRESSION: Postsurgical changes as described. No significant sinus  opacity or layering fluid. No significant facial soft tissue swelling or foreign body. Electronically Signed   By: Staci Righter M.D.   On: 11/17/2018 11:19    CT scan result reviewed to evaluate for sinus or other cause of headache given the left frontal component.  This is negative for acute finding and was discussed with Dr. Richardson Landry ____________________________________________   PROCEDURES  Procedure(s) performed: None  Procedures  Critical Care performed: No  ____________________________________________   INITIAL IMPRESSION / ASSESSMENT AND PLAN / ED COURSE  Pertinent labs & imaging results that were available during my care of the patient were reviewed by me and considered in my medical decision making (see chart for details).   Left-sided and frontal headache.  Associated slight  rhinitis.  Very reassuring clinical exam, reports she had some fevers earlier in the week and question if she could have sinusitis given her previous history.  Patient would like a CT scan, and I think this is reasonable as it would help [place] decide if ongoing antibiotic treatment would be helpful.  She does not have any signs or symptoms that would suggest meningitis.  She is evaluated and felt low risk for coronavirus, afebrile, nontoxic very reassuring exam.  Normal neurologic examination.  Clinical Course as of Nov 16 1213  Sun Nov 17, 2018  1138 Dr. Richardson Landry recommends NSAIDS, pain control. Could be dental or TMJ, doesn't appear acute sinusitis. Recommends no antibiotics.    [MQ]    Clinical Course User Index [MQ] Delman Kitten, MD   Judy Jenkins was evaluated in Emergency Department on 11/17/2018 for the symptoms described in the history of present illness. She was evaluated in the context of the global COVID-19 pandemic, which necessitated consideration that the patient might be at risk for infection with the SARS-CoV-2 virus that causes COVID-19. Institutional protocols and algorithms that pertain to the evaluation of patients at risk for COVID-19 are in a state of rapid change based on information released by regulatory bodies including the CDC and federal and state organizations. These policies and algorithms were followed during the patient's care in the ED.  Patient does not meet testing criteria for coronavirus at this time.  Discussed with the patient her CT findings, she is reassured, and we will discontinue her antibiotic as there is no sign of an ongoing sinusitis at this time.  Careful return precautions and also follow-up with her primary as well as to call Dr. Maisie Fus office Monday given. Patient ina greement.  Return precautions and treatment recommendations and follow-up discussed with the patient who is agreeable with the plan.    ____________________________________________   FINAL CLINICAL IMPRESSION(S) / ED DIAGNOSES  Final diagnoses:  Frontal sinus pain        Note:  This document was prepared using Dragon voice recognition software and may include unintentional dictation errors       Delman Kitten, MD 11/17/18 1215

## 2018-11-17 NOTE — ED Triage Notes (Signed)
Pt states called her MD and her ENT MD and they told her it was probably a sinus infection.

## 2018-11-17 NOTE — ED Notes (Signed)

## 2018-11-17 NOTE — ED Triage Notes (Signed)
Pt reports face pain and headache for the past week. Pt states also running a fever. Denies sore throat but states she has a cough every morning. Pt states that once she coughs all the phlem up in the mornings she does not have a cough the rest of the day.

## 2019-02-03 DIAGNOSIS — I951 Orthostatic hypotension: Secondary | ICD-10-CM | POA: Diagnosis present

## 2019-02-18 ENCOUNTER — Ambulatory Visit: Payer: Medicare HMO | Admitting: Podiatry

## 2019-02-18 ENCOUNTER — Ambulatory Visit (INDEPENDENT_AMBULATORY_CARE_PROVIDER_SITE_OTHER): Payer: Medicare HMO

## 2019-02-18 ENCOUNTER — Encounter: Payer: Self-pay | Admitting: Podiatry

## 2019-02-18 ENCOUNTER — Other Ambulatory Visit: Payer: Self-pay

## 2019-02-18 ENCOUNTER — Other Ambulatory Visit: Payer: Self-pay | Admitting: Podiatry

## 2019-02-18 VITALS — BP 147/69 | HR 68 | Temp 97.3°F

## 2019-02-18 DIAGNOSIS — R600 Localized edema: Secondary | ICD-10-CM

## 2019-02-18 DIAGNOSIS — M79671 Pain in right foot: Secondary | ICD-10-CM

## 2019-02-18 NOTE — Progress Notes (Signed)
   HPI: 84 year old female otherwise healthy presents the office today for evaluation of left foot edema.  Patient states that over the past several years it is slowly increased and she notices a significant amount of swelling despite multiple consultations with different doctors.  Swelling gets worse at nighttime.  She has been to other physicians who diagnosed her with various diagnoses.  She says that the swelling is been ongoing for approximately 5 years now.  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   Past Medical History:  Diagnosis Date  . Cancer ([place])   . Tachycardia      Physical Exam: General: The patient is alert and oriented x3 in no acute distress.  Dermatology: Skin is warm, dry and supple bilateral lower extremities. Negative for open lesions or macerations.  Vascular: Palpable pedal pulses bilaterally.  Significant amount of pitting edema noted to the left foot.  Capillary refill within normal limits.  Neurological: Epicritic and protective threshold grossly intact bilaterally.   Musculoskeletal Exam: Range of motion within normal limits to all pedal and ankle joints bilateral. Muscle strength 5/5 in all groups bilateral.   Radiographic Exam:  Normal osseous mineralization.  Degenerative changes noted to the midfoot of the left foot no fracture/dislocation/boney destruction.    Assessment: 1.  DJD/arthritis left foot 2.  Idiopathic left foot edema-chronic x5 years   Plan of Care:  1. Patient evaluated. X-Rays reviewed.  2.  Today explained to the patient that I am not sure why she is getting this chronic edema.  I do not suspect that it is coming from her arthritis. 3.  Today we will order an MRI to rule out any additional pathology that may be present causing the edema 4.  Return to clinic in 4 weeks to review the  MRI results 5.  In the meantime, continue recommendations by the physicians including elevation, rest, compression stockings      Edrick Kins, DPM [facility]  Dr. Edrick Kins, DPM    [address], [place] [address]                Office [phone]  Fax [phone]

## 2019-02-24 ENCOUNTER — Telehealth: Payer: Self-pay

## 2019-02-24 DIAGNOSIS — M19079 Primary osteoarthritis, unspecified ankle and foot: Secondary | ICD-10-CM

## 2019-02-24 NOTE — Telephone Encounter (Signed)
-----   Message from Edrick Kins, DPM sent at 02/18/2019 11:17 AM EDT ----- Regarding: MRI left foot Please order MRI left foot.   Dx: idiopathic left foot edema   Dr. Amalia Hailey

## 2019-02-24 NOTE — Telephone Encounter (Signed)
Patient has been notified of approval and she will contact scheduling department to set up own appt to her convenience

## 2019-03-07 ENCOUNTER — Ambulatory Visit
Admission: RE | Admit: 2019-03-07 | Discharge: 2019-03-07 | Disposition: A | Discharge status: Home / Self Care | Payer: Medicare HMO | Source: Ambulatory Visit | Attending: Podiatry | Admitting: Podiatry

## 2019-03-07 ENCOUNTER — Other Ambulatory Visit: Payer: Self-pay

## 2019-03-07 DIAGNOSIS — M19079 Primary osteoarthritis, unspecified ankle and foot: Secondary | ICD-10-CM | POA: Diagnosis not present

## 2019-03-25 ENCOUNTER — Ambulatory Visit: Payer: Medicare HMO | Admitting: Podiatry

## 2019-03-25 ENCOUNTER — Encounter: Payer: Self-pay | Admitting: Podiatry

## 2019-03-25 ENCOUNTER — Other Ambulatory Visit: Payer: Self-pay

## 2019-03-25 VITALS — Temp 97.7°F

## 2019-03-25 DIAGNOSIS — M7752 Other enthesopathy of left foot: Secondary | ICD-10-CM | POA: Diagnosis not present

## 2019-03-25 DIAGNOSIS — M778 Other enthesopathies, not elsewhere classified: Secondary | ICD-10-CM

## 2019-03-25 DIAGNOSIS — R600 Localized edema: Secondary | ICD-10-CM

## 2019-03-25 DIAGNOSIS — M19079 Primary osteoarthritis, unspecified ankle and foot: Secondary | ICD-10-CM

## 2019-03-27 NOTE — Progress Notes (Signed)
HPI: 84 year old presents to the office today for follow up evaluation of left foot pain. She reports continued swelling. Being on the foot throughout the day increases the symptoms. She had an MRI of the foot on 03/07/2019. Patient is here for further evaluation and treatment.   Past Medical History:  Diagnosis Date  . Cancer ([place])   . Tachycardia      Physical Exam: General: The patient is alert and oriented x3 in no acute distress.  Dermatology: Skin is warm, dry and supple bilateral lower extremities. Negative for open lesions or macerations.  Vascular: Palpable pedal pulses bilaterally.  Significant amount of pitting edema noted to the left foot.  Capillary refill within normal limits.  Neurological: Epicritic and protective threshold grossly intact bilaterally.   Musculoskeletal Exam: Pain with palpation noted to the 1st met-cuneiform of the left foot. Range of motion within normal limits to all pedal and ankle joints bilateral. Muscle strength 5/5 in all groups bilateral.   MRI Impression:  1. No findings of active osteomyelitis. 2. 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 3. Arthropathy at the articulation between the medial cuneiform and the base of the first metatarsal with notable loss of articular space and subcortical marrow edema. 4. Subcutaneous edema along the dorsum of the foot extending into the toes is nonspecific. Cellulitis is not excluded. 5. Mild extensor hallucis longus tenosynovitis.  Assessment: 1. Capsulitis 1st met-cuneiform left  2. Idiopathic left foot edema-chronic x5 years   Plan of Care:  1. Patient evaluated. MRI reviewed.  2. Injection of 0.5 mLs Celestone Soluspan injected into the 1st met-cuneiform.  3. Unna boot applied to the LLE. Keep clean, dry and intact for one week.  4. Return to clinic in 4 weeks.      Edrick Kins, DPM [facility]   Dr. Edrick Kins, DPM    [address], [place] [address]                Office [phone]  Fax [phone]

## 2019-04-22 ENCOUNTER — Encounter: Payer: Self-pay | Admitting: Podiatry

## 2019-04-22 ENCOUNTER — Other Ambulatory Visit: Payer: Self-pay

## 2019-04-22 ENCOUNTER — Ambulatory Visit: Payer: Medicare HMO | Admitting: Podiatry

## 2019-04-22 DIAGNOSIS — M778 Other enthesopathies, not elsewhere classified: Secondary | ICD-10-CM

## 2019-04-22 DIAGNOSIS — M779 Enthesopathy, unspecified: Secondary | ICD-10-CM | POA: Diagnosis not present

## 2019-04-22 DIAGNOSIS — R600 Localized edema: Secondary | ICD-10-CM

## 2019-04-22 DIAGNOSIS — M19079 Primary osteoarthritis, unspecified ankle and foot: Secondary | ICD-10-CM

## 2019-04-24 NOTE — Progress Notes (Signed)
   HPI: 84 year old presents to the office today for follow up evaluation of left foot pain. She reports continued swelling but states it has improved since her last visit. Being on the foot throughout the day increases the symptoms. She has been using compression hose which have helped alleviate the symptoms. Patient is here for further evaluation and treatment.   Past Medical History:  Diagnosis Date  . Cancer ([place])   . Tachycardia      Physical Exam: General: The patient is alert and oriented x3 in no acute distress.  Dermatology: Skin is warm, dry and supple bilateral lower extremities. Negative for open lesions or macerations.  Vascular: Palpable pedal pulses bilaterally.  Significant amount of pitting edema noted to the left foot.  Capillary refill within normal limits.  Neurological: Epicritic and protective threshold grossly intact bilaterally.   Musculoskeletal Exam: Pain with palpation noted to the 1st met-cuneiform of the left foot. Range of motion within normal limits to all pedal and ankle joints bilateral. Muscle strength 5/5 in all groups bilateral.   Assessment: 1. Capsulitis 1st met-cuneiform left - improved 2. Idiopathic left foot edema-chronic x5 years - improved   Plan of Care:  1. Patient evaluated. 2. Injection of 0.5 mLs Celestone Soluspan injected into the 1st met-cuneiform.  3. Continue using compression hose daily.  4. Return to clinic as needed.      Edrick Kins, DPM [facility]  Dr. Edrick Kins, DPM    [address], [place] [address]                Office [phone]  Fax [phone]

## 2019-08-18 ENCOUNTER — Other Ambulatory Visit: Payer: Self-pay

## 2019-08-18 ENCOUNTER — Ambulatory Visit
Admission: RE | Admit: 2019-08-18 | Discharge: 2019-08-18 | Disposition: A | Discharge status: Home / Self Care | Payer: Medicare HMO | Source: Ambulatory Visit | Attending: Family Medicine | Admitting: Family Medicine

## 2019-08-18 ENCOUNTER — Other Ambulatory Visit: Payer: Self-pay | Admitting: Family Medicine

## 2019-08-18 ENCOUNTER — Ambulatory Visit
Admission: RE | Admit: 2019-08-18 | Discharge: 2019-08-18 | Disposition: A | Discharge status: Home / Self Care | Payer: Medicare HMO | Attending: Family Medicine | Admitting: Family Medicine

## 2019-08-18 DIAGNOSIS — M546 Pain in thoracic spine: Secondary | ICD-10-CM

## 2020-01-23 ENCOUNTER — Other Ambulatory Visit: Payer: Self-pay

## 2020-01-23 ENCOUNTER — Emergency Department
Admission: EM | Admit: 2020-01-23 | Discharge: 2020-01-23 | Disposition: A | Discharge status: Home / Self Care | Payer: Medicare HMO | Attending: Emergency Medicine | Admitting: Emergency Medicine

## 2020-01-23 DIAGNOSIS — K5900 Constipation, unspecified: Secondary | ICD-10-CM

## 2020-01-23 DIAGNOSIS — Z859 Personal history of malignant neoplasm, unspecified: Secondary | ICD-10-CM | POA: Diagnosis not present

## 2020-01-23 DIAGNOSIS — Z79899 Other long term (current) drug therapy: Secondary | ICD-10-CM | POA: Diagnosis not present

## 2020-01-23 DIAGNOSIS — R198 Other specified symptoms and signs involving the digestive system and abdomen: Secondary | ICD-10-CM | POA: Insufficient documentation

## 2020-01-23 LAB — BASIC METABOLIC PANEL
Anion gap: 7 (ref 5–15)
BUN: 14 mg/dL (ref 8–23)
CO2: 27 mmol/L (ref 22–32)
Calcium: 8.8 mg/dL — ABNORMAL LOW (ref 8.9–10.3)
Chloride: 101 mmol/L (ref 98–111)
Creatinine, Ser: 1.04 mg/dL — ABNORMAL HIGH (ref 0.44–1.00)
GFR calc Af Amer: 58 mL/min — ABNORMAL LOW (ref >60)
GFR calc non Af Amer: 50 mL/min — ABNORMAL LOW (ref >60)
Glucose, Bld: 105 mg/dL — ABNORMAL HIGH (ref 70–99)
Potassium: 4.2 mmol/L (ref 3.5–5.1)
Sodium: 135 mmol/L (ref 135–145)

## 2020-01-23 LAB — CBC
HCT: 36 % (ref 36.0–46.0)
Hemoglobin: 12.1 g/dL (ref 12.0–15.0)
MCH: 31.3 pg (ref 26.0–34.0)
MCHC: 33.6 g/dL (ref 30.0–36.0)
MCV: 93 fL (ref 80.0–100.0)
Platelets: 323 10*3/uL (ref 150–400)
RBC: 3.87 MIL/uL (ref 3.87–5.11)
RDW: 11.9 % (ref 11.5–15.5)
WBC: 7.7 10*3/uL (ref 4.0–10.5)
nRBC: 0 % (ref 0.0–0.2)

## 2020-01-23 MED ORDER — SODIUM CHLORIDE 0.9 % IV BOLUS
500.0000 mL | Freq: Once | INTRAVENOUS | Status: AC
  Administered 2020-01-23: 500 mL via INTRAVENOUS

## 2020-01-23 NOTE — ED Notes (Signed)
Patient and husband state they are ready to go now. Dr. Jacqualine Code aware.

## 2020-01-23 NOTE — ED Notes (Signed)
Lab called to inform that orders are in for labs.

## 2020-01-23 NOTE — ED Provider Notes (Signed)
[facility] Emergency Department Provider Note   ____________________________________________   First MD Initiated Contact with Patient 01/23/20 [phone]     (approximate)  I have reviewed the triage vital signs and the nursing notes.   HISTORY  Chief Complaint Constipation    HPI Charleston Vierling is a 85 y.o. female for evaluation after severe episode of impaction  Patient reports that she had a impaction in the past 2 extremely large stool a couple days ago.  She is no longer having any more pain or feeling of constipation, but since then she has noticed a slight small amount of mucus-like discharge from the rectum.  No bleeding no nausea or vomiting.  Feels little dehydrated she was not eating much when she was so constipated for a couple of days.  She got relief with an enema as well as MiraLAX  She has had a very long history of severe constipation, has previously seen in GI but has not been able to secure a follow-up visit until August or maybe even October  Currently no pain or discomfort.  All of her symptoms are relieved except she is seeing a slight occasional small amount of mucus in the rectal area.  She reports when she had the large bowel movement it was extremely painful but is now relieved entirely for the last day and a half.  When she was so very backed up at one point she had difficulty even urinating because of how constipated she was, that has completely resolved now as well and she is urinating normally  Patient reports that she cannot get into see a GI physician should like some recommendations on how to make sure she does not get so constipated in the future   Past Medical History:  Diagnosis Date  . Cancer ([place])   . Tachycardia     There are no problems to display for this patient.   Past Surgical History:  Procedure Laterality Date  . ABDOMINAL HYSTERECTOMY    . BREAST SURGERY    . CHOLECYSTECTOMY      Prior to Admission  medications   Medication Sig Start Date End Date Taking? Authorizing Provider  ALPRAZolam Duanne Moron) 0.25 MG tablet Take 0.25 mg by mouth at bedtime as needed for anxiety.    [provider]  amitriptyline (ELAVIL) 10 MG tablet Take 10 mg by mouth at bedtime.    [provider]  amoxicillin-clavulanate (AUGMENTIN) 875-125 MG tablet Take 1 tablet by mouth every 12 (twelve) hours. 08/27/17   Marylene Land, NP  esomeprazole (NEXIUM) 20 MG capsule Take 20 mg by mouth daily at 12 noon.    [provider]  levothyroxine (SYNTHROID, LEVOTHROID) 50 MCG tablet Take 50 mcg by mouth daily before breakfast.    [provider]  meclizine (ANTIVERT) 25 MG tablet Take 25 mg by mouth 3 (three) times daily as needed for dizziness.    [provider]  metoprolol succinate (TOPROL-XL) 25 MG 24 hr tablet Take 25 mg by mouth daily.    [provider]  ondansetron (ZOFRAN ODT) 4 MG disintegrating tablet Take 1 tablet (4 mg total) by mouth every 8 (eight) hours as needed for nausea. 08/27/17   Marylene Land, NP  simvastatin (ZOCOR) 20 MG tablet Take 20 mg by mouth daily.    [provider]    Allergies Avelox [moxifloxacin hcl in nacl], Ciprofloxacin, Biaxin [clarithromycin], Atorvastatin, Codeine, and Sulfa antibiotics  Family History  Problem Relation Age of Onset  .  CAD Mother   . CAD Father     Social History Social History   Tobacco Use  . Smoking status: Never Smoker  . Smokeless tobacco: Never Used  Substance Use Topics  . Alcohol use: No  . Drug use: No    Review of Systems Constitutional: No fever/chills Cardiovascular: Denies chest pain. Respiratory: Denies shortness of breath. Gastrointestinal: No abdominal pain.  See HPI Genitourinary: Negative for dysuria. Musculoskeletal: Negative for back pain. Skin: Negative for rash. Neurological: Negative for headaches, areas of focal weakness or  numbness.    ____________________________________________   PHYSICAL EXAM:  VITAL SIGNS: ED Triage Vitals  Enc Vitals Group     BP 01/23/20 0901 120/65     Pulse Rate 01/23/20 0901 77     Resp 01/23/20 0901 18     Temp 01/23/20 0901 98.6 F (37 C)     Temp Source 01/23/20 0901 Oral     SpO2 01/23/20 0901 98 %     Weight 01/23/20 0902 125 lb (56.7 kg)     Height 01/23/20 0902 5\' 7"  (1.702 m)     Head Circumference --      Peak Flow --      Pain Score 01/23/20 0907 0     Pain Loc --      Pain Edu? --      Excl. in [place]? --     Constitutional: Alert and oriented. Well appearing and in no acute distress. Eyes: Conjunctivae are normal. Head: Atraumatic. Nose: No congestion/rhinnorhea. Mouth/Throat: Mucous membranes are moist. Neck: No stridor.  Cardiovascular: Normal rate, regular rhythm. Grossly normal heart sounds.  Good peripheral circulation. Respiratory: Normal respiratory effort.  No retractions. Lungs CTAB. Gastrointestinal: Soft and nontender. No distention. Rectal exam performed with nurse Vicente Males.  Normal external exam.  Internal exam normal, no fecal impaction no tenderness no stool in the vault.  A very small extremely small perhaps size of a pinhead amount of slight whitish appearing mucus is present in the rectal vault Musculoskeletal: No lower extremity tenderness nor edema. Neurologic:  Normal speech and language. No gross focal neurologic deficits are appreciated.  Skin:  Skin is warm, dry and intact. No rash noted. Psychiatric: Mood and affect are normal. Speech and behavior are normal.  ____________________________________________   LABS (all labs ordered are listed, but only abnormal results are displayed)  Labs Reviewed  BASIC METABOLIC PANEL - Abnormal; Notable for the following components:      Result Value   Glucose, Bld 105 (*)    Creatinine, Ser 1.04 (*)    Calcium 8.8 (*)    GFR calc non Af Amer 50 (*)    GFR calc Af Amer 58 (*)    All other  components within normal limits  CBC   ____________________________________________  EKG   ____________________________________________  RADIOLOGY  I do not see indication for imaging, patient reports all of her symptoms have been relieved now completely and she has no abdominal pain or rectal pain on exam.  Nontoxic well-appearing ____________________________________________   PROCEDURES  Procedure(s) performed: None  Procedures  Critical Care performed: No  ____________________________________________   INITIAL IMPRESSION / ASSESSMENT AND PLAN / ED COURSE  Pertinent labs & imaging results that were available during my care of the patient were reviewed by me and considered in my medical decision making (see chart for details).   Patient reports what appears to been likely a large fecal impaction and she was able to relieve by herself but now concerned  about strategies for reducing risk of recurrence and how to manage constipation.  Slight mucus seen on exam, but I suspect this is likely secondary to the severe fecal impaction which she experienced.  Perhaps she could have had a small rectal tear or otherwise it may be healing now but she nonetheless appears very well nontoxic reassuring examination with complete relief of all of her previous symptoms  Discussed strategies including use of over-the-counter MiraLAX, stool softeners, etc. with the patient and she is understanding agreeable with this plan.  Will provide a small fluid bolus and check basic labs as she does reports she feels dehydrated after going a day or 2 with decreased appetite, but that is now much improved     ----------------------------------------- 12:59 PM on 01/23/2020 ----------------------------------------- Patient feels well no complaint.  Comfortable with plan for discharge.  Husband at bedside as well, agreeable with plan  Return precautions and treatment recommendations and follow-up discussed  with the patient who is agreeable with the plan.   ____________________________________________   FINAL CLINICAL IMPRESSION(S) / ED DIAGNOSES  Final diagnoses:  Constipation, unspecified constipation type        Note:  This document was prepared using Dragon voice recognition software and may include unintentional dictation errors       Delman Kitten, MD 01/23/20 1301

## 2020-01-23 NOTE — ED Triage Notes (Signed)
Pt states that she has been having issues with constipation states that she was able to pass a large amount of stool on Tuesday, states that she has been using laxatives and then ended up using enemas on Tuesday, last bm at this time, states that ever since passing the stool each time she stands mucous comes from her rectum, states that she is unable to get an appointment with GI until the end of June and felt like the mucous needed to be addressed before then, states that she needs assistance on what to do and what to take to prevent such severe constipation in the future, pt states that she is also feeling weak, states this started 2 weeks ago and has been seen by cardiology for this

## 2020-01-23 NOTE — Discharge Instructions (Signed)
You were seen in the emergency department today for constipation.  We recommend that you use one or more of the following over-the-counter medications in the order described: °  °1)  Colace (or Dulcolax) 100 mg:  This is a stool softener, and you may take it once or twice a day as needed. °2)  Senna tablets:  This is a bowel stimulant that will help "push" out your stool. It is the next step to add after you have tried a stool softener. °3)  Miralax (powder):  This medication works by drawing additional fluid into your intestines and helps to flush out your stool.  Mix the powder with water or juice according to label instructions.  It may help if the Colace and Senna are not sufficient, but you must be sure to use the recommended amount of water or juice when you mix up the powder. °Remember that narcotic pain medications are constipating, so avoid them or minimize their use.  Drink plenty of fluids. ° °Please return to the Emergency Department immediately if you develop new or worsening symptoms that concern you, such as (but not limited to) fever > 101 degrees, severe abdominal pain, or persistent vomiting. ° °

## 2020-03-10 ENCOUNTER — Other Ambulatory Visit: Payer: Self-pay | Admitting: Internal Medicine

## 2020-03-10 DIAGNOSIS — R270 Ataxia, unspecified: Secondary | ICD-10-CM

## 2020-03-26 ENCOUNTER — Other Ambulatory Visit: Payer: Self-pay

## 2020-03-26 ENCOUNTER — Ambulatory Visit
Admission: RE | Admit: 2020-03-26 | Discharge: 2020-03-26 | Disposition: A | Discharge status: Home / Self Care | Payer: Medicare HMO | Source: Ambulatory Visit | Attending: Internal Medicine | Admitting: Internal Medicine

## 2020-03-26 DIAGNOSIS — R270 Ataxia, unspecified: Secondary | ICD-10-CM | POA: Insufficient documentation

## 2020-03-26 MED ORDER — GADOBUTROL 1 MMOL/ML IV SOLN
6.0000 mL | Freq: Once | INTRAVENOUS | Status: AC | PRN
  Administered 2020-03-26: 6 mL via INTRAVENOUS

## 2020-10-04 ENCOUNTER — Other Ambulatory Visit: Payer: Self-pay | Admitting: Family Medicine

## 2020-10-04 DIAGNOSIS — R591 Generalized enlarged lymph nodes: Secondary | ICD-10-CM

## 2020-10-07 ENCOUNTER — Other Ambulatory Visit: Payer: Self-pay

## 2020-10-07 ENCOUNTER — Ambulatory Visit
Admission: RE | Admit: 2020-10-07 | Discharge: 2020-10-07 | Disposition: A | Discharge status: Home / Self Care | Payer: Medicare HMO | Source: Ambulatory Visit | Attending: Family Medicine | Admitting: Family Medicine

## 2020-10-07 DIAGNOSIS — R591 Generalized enlarged lymph nodes: Secondary | ICD-10-CM | POA: Diagnosis present

## 2020-10-20 ENCOUNTER — Other Ambulatory Visit: Payer: Self-pay | Admitting: Otolaryngology

## 2020-10-20 DIAGNOSIS — R221 Localized swelling, mass and lump, neck: Secondary | ICD-10-CM

## 2020-11-04 ENCOUNTER — Ambulatory Visit
Admission: RE | Admit: 2020-11-04 | Discharge: 2020-11-04 | Disposition: A | Discharge status: Home / Self Care | Payer: Medicare HMO | Source: Ambulatory Visit | Attending: Otolaryngology | Admitting: Otolaryngology

## 2020-11-04 ENCOUNTER — Other Ambulatory Visit: Payer: Self-pay

## 2020-11-04 DIAGNOSIS — R221 Localized swelling, mass and lump, neck: Secondary | ICD-10-CM | POA: Insufficient documentation

## 2020-11-04 LAB — POCT I-STAT CREATININE: Creatinine, Ser: 0.8 mg/dL (ref 0.44–1.00)

## 2020-11-04 MED ORDER — IOHEXOL 300 MG/ML  SOLN
75.0000 mL | Freq: Once | INTRAMUSCULAR | Status: AC | PRN
  Administered 2020-11-04: 75 mL via INTRAVENOUS

## 2020-12-07 ENCOUNTER — Emergency Department: Payer: Medicare HMO

## 2020-12-07 ENCOUNTER — Emergency Department
Admission: EM | Admit: 2020-12-07 | Discharge: 2020-12-07 | Disposition: A | Discharge status: Home / Self Care | Payer: Medicare HMO | Attending: Emergency Medicine | Admitting: Emergency Medicine

## 2020-12-07 ENCOUNTER — Other Ambulatory Visit: Payer: Self-pay

## 2020-12-07 DIAGNOSIS — K5901 Slow transit constipation: Secondary | ICD-10-CM | POA: Diagnosis not present

## 2020-12-07 DIAGNOSIS — K5900 Constipation, unspecified: Secondary | ICD-10-CM | POA: Diagnosis present

## 2020-12-07 DIAGNOSIS — Z859 Personal history of malignant neoplasm, unspecified: Secondary | ICD-10-CM | POA: Insufficient documentation

## 2020-12-07 MED ORDER — MAGNESIUM CITRATE PO SOLN: 1.0000 | Freq: Once | ORAL | 1 refills | Status: AC

## 2020-12-07 MED ORDER — BISACODYL 10 MG RE SUPP: 10.0000 mg | RECTAL | 0 refills | Status: DC | PRN

## 2020-12-07 MED ORDER — POLYETHYLENE GLYCOL 3350 17 GM/SCOOP PO POWD: 1.0000 | Freq: Once | ORAL | 0 refills | Status: AC

## 2020-12-07 NOTE — ED Triage Notes (Signed)
Pt to ER via POV with complaints of constipation x 10 days.  Reports lack of appetite after completing radiation to R side for breast cancer x1 week. Reports drinking protein drinks and increased water intake. Pt denies abdominal pain/ pain to rectum. Unsure if still passing gas.

## 2020-12-07 NOTE — ED Provider Notes (Signed)
[facility] Emergency Department Provider Note   ____________________________________________    I have reviewed the triage vital signs and the nursing notes.   HISTORY  Chief Complaint Constipation     HPI Judy Jenkins is a 86 y.o. female who presents with complaints of constipation.  Patient reports she has not had a proper bowel movement in nearly 10 days.  She does report history of chronic constipation.  She has been using MiraLAX as well as Fleet suppositories without improvement.  She denies abdominal pain.  No rectal pain.  No nausea or vomiting.  No history of small bowel obstruction.  Past Medical History:  Diagnosis Date  . Cancer ([place])   . Tachycardia     There are no problems to display for this patient.   Past Surgical History:  Procedure Laterality Date  . ABDOMINAL HYSTERECTOMY    . BREAST SURGERY    . CHOLECYSTECTOMY      Prior to Admission medications   Medication Sig Start Date End Date Taking? Authorizing Provider  bisacodyl (DULCOLAX) 10 MG suppository Place 1 suppository (10 mg total) rectally as needed for moderate constipation. 12/07/20  Yes Lavonia Drafts, MD  magnesium citrate SOLN Take 296 mLs (1 Bottle total) by mouth once for 1 dose. 12/07/20 12/07/20 Yes Lavonia Drafts, MD  polyethylene glycol powder (MIRALAX) 17 GM/SCOOP powder Take 255 g by mouth once for 1 dose. 12/07/20 12/07/20 Yes Lavonia Drafts, MD  ALPRAZolam Duanne Moron) 0.25 MG tablet Take 0.25 mg by mouth at bedtime as needed for anxiety.    [provider]  amitriptyline (ELAVIL) 10 MG tablet Take 10 mg by mouth at bedtime.    [provider]  amoxicillin-clavulanate (AUGMENTIN) 875-125 MG tablet Take 1 tablet by mouth every 12 (twelve) hours. 08/27/17   Marylene Land, NP  esomeprazole (NEXIUM) 20 MG capsule Take 20 mg by mouth daily at 12 noon.    [provider]  levothyroxine (SYNTHROID, LEVOTHROID) 50 MCG tablet Take 50 mcg  by mouth daily before breakfast.    [provider]  meclizine (ANTIVERT) 25 MG tablet Take 25 mg by mouth 3 (three) times daily as needed for dizziness.    [provider]  metoprolol succinate (TOPROL-XL) 25 MG 24 hr tablet Take 25 mg by mouth daily.    [provider]  ondansetron (ZOFRAN ODT) 4 MG disintegrating tablet Take 1 tablet (4 mg total) by mouth every 8 (eight) hours as needed for nausea. 08/27/17   Marylene Land, NP  simvastatin (ZOCOR) 20 MG tablet Take 20 mg by mouth daily.    [provider]     Allergies Avelox [moxifloxacin hcl in nacl], Ciprofloxacin, Biaxin [clarithromycin], Atorvastatin, Codeine, and Sulfa antibiotics  Family History  Problem Relation Age of Onset  . CAD Mother   . CAD Father     Social History Social History   Tobacco Use  . Smoking status: Never Smoker  . Smokeless tobacco: Never Used  Substance Use Topics  . Alcohol use: No  . Drug use: No    Review of Systems  Constitutional: No fever/chills  Gastrointestinal: As above Genitourinary: Negative for urinary retention Musculoskeletal: Negative for back pain. Skin: Negative for rash. Neurological: Negative for headaches    ____________________________________________   PHYSICAL EXAM:  VITAL SIGNS: ED Triage Vitals  Enc Vitals Group     BP 12/07/20 1044 (!) 177/77     Pulse Rate 12/07/20 1044 79     Resp 12/07/20 1044  16     Temp 12/07/20 1044 97.9 F (36.6 C)     Temp Source 12/07/20 1044 Oral     SpO2 12/07/20 1044 99 %     Weight --      Height 12/07/20 1045 1.702 m (5\' 7" )     Head Circumference --      Peak Flow --      Pain Score 12/07/20 1045 0     Pain Loc --      Pain Edu? --      Excl. in [place]? --     Constitutional: Alert and oriented. No acute distress. Pleasant and interactive  Nose: No congestion/rhinnorhea.  Cardiovascular: Normal rate, regular rhythm.  Good peripheral circulation. Respiratory: Normal respiratory  effort.  No retractions.  Gastrointestinal: Soft and nontender. No distention.  No CVA tenderness.  Reassuring exam  Musculoskeletal: No lower extremity tenderness nor edema.  Warm and well perfused Neurologic:  Normal speech and language. No gross focal neurologic deficits are appreciated.  Skin:  Skin is warm, dry and intact. No rash noted. Psychiatric: Mood and affect are normal. Speech and behavior are normal.  ____________________________________________   LABS (all labs ordered are listed, but only abnormal results are displayed)  Labs Reviewed - No data to display ____________________________________________  EKG  None ____________________________________________  RADIOLOGY  KUB reviewed by me, no evidence of fecal impaction or obstruction ____________________________________________   PROCEDURES  Procedure(s) performed: No  Procedures   Critical Care performed: No ____________________________________________   INITIAL IMPRESSION / ASSESSMENT AND PLAN / ED COURSE  Pertinent labs & imaging results that were available during my care of the patient were reviewed by me and considered in my medical decision making (see chart for details).  Patient with constipation as noted above, she does have a history of constipation.  KUB obtained which is quite reassuring, no abdominal tenderness to palpation.  No fecal impaction noted on KUB.  Will Rx stool softener/laxatives for medical management.    ____________________________________________   FINAL CLINICAL IMPRESSION(S) / ED DIAGNOSES  Final diagnoses:  Slow transit constipation        Note:  This document was prepared using Dragon voice recognition software and may include unintentional dictation errors.   Lavonia Drafts, MD 12/07/20 1157

## 2021-01-20 ENCOUNTER — Other Ambulatory Visit: Payer: Self-pay

## 2021-01-20 ENCOUNTER — Emergency Department: Payer: Medicare HMO

## 2021-01-20 ENCOUNTER — Emergency Department
Admission: EM | Admit: 2021-01-20 | Discharge: 2021-01-20 | Disposition: A | Discharge status: Home / Self Care | Payer: Medicare HMO | Attending: Emergency Medicine | Admitting: Emergency Medicine

## 2021-01-20 DIAGNOSIS — R002 Palpitations: Secondary | ICD-10-CM | POA: Insufficient documentation

## 2021-01-20 DIAGNOSIS — Z859 Personal history of malignant neoplasm, unspecified: Secondary | ICD-10-CM | POA: Diagnosis not present

## 2021-01-20 DIAGNOSIS — Q401 Congenital hiatus hernia: Secondary | ICD-10-CM | POA: Insufficient documentation

## 2021-01-20 DIAGNOSIS — K449 Diaphragmatic hernia without obstruction or gangrene: Secondary | ICD-10-CM

## 2021-01-20 DIAGNOSIS — I700 Atherosclerosis of aorta: Secondary | ICD-10-CM | POA: Insufficient documentation

## 2021-01-20 DIAGNOSIS — R42 Dizziness and giddiness: Secondary | ICD-10-CM | POA: Insufficient documentation

## 2021-01-20 LAB — BASIC METABOLIC PANEL
Anion gap: 6 (ref 5–15)
BUN: 13 mg/dL (ref 8–23)
CO2: 29 mmol/L (ref 22–32)
Calcium: 9.3 mg/dL (ref 8.9–10.3)
Chloride: 98 mmol/L (ref 98–111)
Creatinine, Ser: 0.88 mg/dL (ref 0.44–1.00)
GFR, Estimated: >60 mL/min (ref >60)
Glucose, Bld: 110 mg/dL — ABNORMAL HIGH (ref 70–99)
Potassium: 4.8 mmol/L (ref 3.5–5.1)
Sodium: 133 mmol/L — ABNORMAL LOW (ref 135–145)

## 2021-01-20 LAB — CBC
HCT: 40.2 % (ref 36.0–46.0)
Hemoglobin: 13.6 g/dL (ref 12.0–15.0)
MCH: 31.7 pg (ref 26.0–34.0)
MCHC: 33.8 g/dL (ref 30.0–36.0)
MCV: 93.7 fL (ref 80.0–100.0)
Platelets: 257 10*3/uL (ref 150–400)
RBC: 4.29 MIL/uL (ref 3.87–5.11)
RDW: 12 % (ref 11.5–15.5)
WBC: 4.9 10*3/uL (ref 4.0–10.5)
nRBC: 0 % (ref 0.0–0.2)

## 2021-01-20 LAB — MAGNESIUM: Magnesium: 2.2 mg/dL (ref 1.7–2.4)

## 2021-01-20 NOTE — ED Provider Notes (Signed)
[facility] Emergency Department Provider Note  ____________________________________________   Event Date/Time   First MD Initiated Contact with Patient 01/20/21 [phone]     (approximate)  I have reviewed the triage vital signs and the nursing notes.   HISTORY  Chief Complaint Tachycardia   HPI Judy Jenkins is a 86 y.o. female with a past medical history of HDL and postural dizziness and palpitations currently treated with metoprolol who presents for assessment stating she feels her heart rate is not adequately controlled on her current metoprolol dose.  She states she is dealing with this issue for many weeks if not many months and felt her heart rate was a little higher than usual over the past week and she felt a little more tired than usual.  She states that for a week she has felt a little lightheaded whenever she stands up too quickly and not sure if it is related to the metoprolol or not.  She states over the last week she has increased her dose to take it twice a day after being advised to do so by her cardiologist.  He states this does not help too much.  She denies any syncopal episodes, headache, earache, sore throat, vertigo, vision changes, chest pain, cough, shortness of breath, back pain abdominal pain, nausea, vomiting, diarrhea or dysuria, rash or any recent falls or injuries.  No new acute change over the last couple days as far as how she is feeling but feels that metoprolol is not working as well as it used to for heart rate control.  Never been diagnosed with A. fib and does not think she has any other heart problems.         Past Medical History:  Diagnosis Date  . Cancer ([place])   . Tachycardia     There are no problems to display for this patient.   Past Surgical History:  Procedure Laterality Date  . ABDOMINAL HYSTERECTOMY    . BREAST SURGERY    . CHOLECYSTECTOMY      Prior to Admission medications   Medication Sig Start  Date End Date Taking? Authorizing Provider  ALPRAZolam Duanne Moron) 0.25 MG tablet Take 0.25 mg by mouth at bedtime as needed for anxiety.    [provider]  amitriptyline (ELAVIL) 10 MG tablet Take 10 mg by mouth at bedtime.    [provider]  amoxicillin-clavulanate (AUGMENTIN) 875-125 MG tablet Take 1 tablet by mouth every 12 (twelve) hours. 08/27/17   Marylene Land, NP  bisacodyl (DULCOLAX) 10 MG suppository Place 1 suppository (10 mg total) rectally as needed for moderate constipation. 12/07/20   Lavonia Drafts, MD  esomeprazole (NEXIUM) 20 MG capsule Take 20 mg by mouth daily at 12 noon.    [provider]  levothyroxine (SYNTHROID, LEVOTHROID) 50 MCG tablet Take 50 mcg by mouth daily before breakfast.    [provider]  meclizine (ANTIVERT) 25 MG tablet Take 25 mg by mouth 3 (three) times daily as needed for dizziness.    [provider]  metoprolol succinate (TOPROL-XL) 25 MG 24 hr tablet Take 25 mg by mouth daily.    [provider]  ondansetron (ZOFRAN ODT) 4 MG disintegrating tablet Take 1 tablet (4 mg total) by mouth every 8 (eight) hours as needed for nausea. 08/27/17   Marylene Land, NP  simvastatin (ZOCOR) 20 MG tablet Take 20 mg by mouth daily.    [provider]    Allergies Avelox [moxifloxacin hcl in nacl],  Ciprofloxacin, Biaxin [clarithromycin], Atorvastatin, Codeine, and Sulfa antibiotics  Family History  Problem Relation Age of Onset  . CAD Mother   . CAD Father     Social History Social History   Tobacco Use  . Smoking status: Never Smoker  . Smokeless tobacco: Never Used  Substance Use Topics  . Alcohol use: No  . Drug use: No    Review of Systems  Review of Systems  Constitutional: Positive for malaise/fatigue. Negative for chills and fever.  HENT: Negative for sore throat.   Eyes: Negative for pain.  Respiratory: Negative for cough and stridor.   Cardiovascular: Positive for palpitations.  Negative for chest pain.  Gastrointestinal: Negative for vomiting.  Genitourinary: Negative for dysuria.  Musculoskeletal: Negative for myalgias.  Skin: Negative for rash.  Neurological: Positive for dizziness ( lightheaded on standing over last several weeks). Negative for seizures, loss of consciousness and headaches.  Psychiatric/Behavioral: Negative for suicidal ideas.  All other systems reviewed and are negative.     ____________________________________________   PHYSICAL EXAM:  VITAL SIGNS: ED Triage Vitals  Enc Vitals Group     BP 01/20/21 0825 128/73     Pulse Rate 01/20/21 0825 83     Resp 01/20/21 0825 18     Temp 01/20/21 0825 98 F (36.7 C)     Temp Source 01/20/21 0825 Oral     SpO2 01/20/21 0825 100 %     Weight 01/20/21 0826 125 lb (56.7 kg)     Height 01/20/21 0826 5\' 7"  (1.702 m)     Head Circumference --      Peak Flow --      Pain Score 01/20/21 0826 0     Pain Loc --      Pain Edu? --      Excl. in [place]? --    Vitals:   01/20/21 0928 01/20/21 0930  BP:  (!) 143/77  Pulse: 78 76  Resp: 15   Temp:    SpO2: 99% 100%   Physical Exam Vitals and nursing note reviewed.  Constitutional:      General: She is not in acute distress.    Appearance: She is well-developed.  HENT:     Head: Normocephalic and atraumatic.     Right Ear: External ear normal.     Left Ear: External ear normal.     Nose: Nose normal.  Eyes:     Conjunctiva/sclera: Conjunctivae normal.  Cardiovascular:     Rate and Rhythm: Normal rate and regular rhythm.     Heart sounds: No murmur heard.   Pulmonary:     Effort: Pulmonary effort is normal. No respiratory distress.     Breath sounds: Normal breath sounds.  Abdominal:     Palpations: Abdomen is soft.     Tenderness: There is no abdominal tenderness.  Musculoskeletal:     Cervical back: Neck supple.  Skin:    General: Skin is warm and dry.  Neurological:     General: No focal deficit present.     Mental Status: She  is alert and oriented to person, place, and time.  Psychiatric:        Mood and Affect: Mood normal.      ____________________________________________   LABS (all labs ordered are listed, but only abnormal results are displayed)  Labs Reviewed  BASIC METABOLIC PANEL - Abnormal; Notable for the following components:      Result Value   Sodium 133 (*)    Glucose, Bld 110 (*)  All other components within normal limits  CBC  MAGNESIUM   ____________________________________________  EKG  Sinus rhythm with a ventricular rate of 70, normal axis, unremarkable intervals without evidence of acute ischemia or significant Arrhythmia ____________________________________________  RADIOLOGY  ED MD interpretation: Significant event has changes without any overt edema, large effusion, pneumothorax, focal consolidation or any other clear acute thoracic process.  Evidence of a hiatal hernia and aortic atherosclerosis.  Official radiology report(s): DG Chest 2 View  Result Date: 01/20/2021 CLINICAL DATA:  Shortness of breath and tachycardia EXAM: CHEST - 2 VIEW COMPARISON:  August 18, 2019 FINDINGS: Lungs are hyperexpanded with mild scarring in the left base. No edema or airspace opacity. Heart size is normal. Pulmonary vascular is stable. There is somewhat diminished vascularity in the upper lobes. No adenopathy. There is aortic atherosclerosis. Hiatal hernia present. No bone lesions. Surgical clips right breast region. IMPRESSION: Suspect a degree of underlying emphysematous change, stable. No edema or airspace opacity. Slight scarring left base. Stable cardiac silhouette. Hiatal hernia present. Aortic Atherosclerosis (ICD10-I70.0) and Emphysema (ICD10-J43.9). Electronically Signed   By: Lowella Grip III M.D.   On: 01/20/2021 08:44    ____________________________________________   PROCEDURES  Procedure(s) performed (including Critical Care):  .1-3 Lead EKG Interpretation Performed  by: Lucrezia Starch, MD Authorized by: Lucrezia Starch, MD     Interpretation: normal     ECG rate assessment: normal     Rhythm: sinus rhythm     Ectopy: none     Conduction: normal       ____________________________________________   INITIAL IMPRESSION / ASSESSMENT AND PLAN / ED COURSE      Patient presents with above-stated history exam with concerns her metoprolol is helping with her heart rate is much that usually does she states that over the past week has intermittently gone up to 105 and 110.  She states she has had weeks of issues with some lightheadedness when she stands up too quickly and thinks this may be related to her bolus level is not sure.  She states that she has been taking it twice a day after discussion with her cardiologist who does not feel this is helping but does not significantly seem to change lightheadedness on standing.  She has not had any syncopal episodes or falls or any other associated sick symptoms.  On arrival she is afebrile hemodynamically stable with a heart rate of 83.  History is certainly concerning for some possible symptomatic orthostasis from effects of increasing metoprolol with seems that patient feels the increase metoprolol is not helped with her elevated heart rates.  Emergency room here and her heart rate is normal.  Was noted to have significant decrease in her systolic blood pressure under 105 on standing is returned back to 143/77 after few minutes and patient denies any associated symptoms.  Overall ECG and absence of any chest pain make ACS less concerning at this time.  Low suspicion for dissection or PE there is no evidence on chest x-ray of pneumonia pneumothorax or heart failure.  CBC and CMP unremarkable for electrolyte or metabolic derangements or acute anemia explain patient's symptoms.  He needs and is WNL.  Discussed patient's presentation with on-call cardiologist Dr. Ubaldo Glassing recommended no acute medication changes at this time  but stated he would try to get the patient into his clinic to be seen tomorrow I would contact the patient sooner if he would like to make any subsequent changes to her medications. This with patient.  Given she denies any acute symptoms otherwise stable vitals and reassuring work-up I think she is safe for discharge with close outpatient cardiology follow-up.  Discharged stable condition.  Strict return precautions advised and discussed.        ____________________________________________   FINAL CLINICAL IMPRESSION(S) / ED DIAGNOSES  Final diagnoses:  Hiatal hernia  Aortic atherosclerosis ([place])  Palpitations  Lightheaded    Medications - No data to display   ED Discharge Orders    None       Note:  This document was prepared using Dragon voice recognition software and may include unintentional dictation errors.   Lucrezia Starch, MD 01/20/21 1005

## 2021-01-20 NOTE — ED Triage Notes (Signed)
Pt states she has a hx of tachycardia and takes metoprolol for it and for the past couple weeks has felt like it has gotten worse- pt states she has been taking double her metoprolol to keep her BP down and her HR down- pt denies CP but states she does have intermittent SHOB

## 2021-01-20 NOTE — ED Notes (Signed)
Pt to ED c/o tachycardia. 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 States feels weak and when she stands up for long periods of time she feels dizzy and weak.  Pt ambulatory to ED room. Dr Tamala Julian at bedside.

## 2021-11-16 ENCOUNTER — Other Ambulatory Visit
Admission: RE | Admit: 2021-11-16 | Discharge: 2021-11-16 | Disposition: A | Discharge status: Home / Self Care | Payer: Medicare HMO | Source: Ambulatory Visit | Attending: Cardiology | Admitting: Cardiology

## 2021-11-16 DIAGNOSIS — R600 Localized edema: Secondary | ICD-10-CM | POA: Diagnosis present

## 2021-11-16 LAB — BRAIN NATRIURETIC PEPTIDE: B Natriuretic Peptide: 100.6 pg/mL — ABNORMAL HIGH (ref 0.0–100.0)

## 2021-12-20 ENCOUNTER — Ambulatory Visit (INDEPENDENT_AMBULATORY_CARE_PROVIDER_SITE_OTHER): Payer: Medicare HMO | Admitting: Vascular Surgery

## 2021-12-20 ENCOUNTER — Encounter (INDEPENDENT_AMBULATORY_CARE_PROVIDER_SITE_OTHER): Payer: Self-pay | Admitting: Nurse Practitioner

## 2021-12-20 DIAGNOSIS — M7989 Other specified soft tissue disorders: Secondary | ICD-10-CM

## 2021-12-20 DIAGNOSIS — M79605 Pain in left leg: Secondary | ICD-10-CM

## 2021-12-20 DIAGNOSIS — M79604 Pain in right leg: Secondary | ICD-10-CM

## 2021-12-20 DIAGNOSIS — C50919 Malignant neoplasm of unspecified site of unspecified female breast: Secondary | ICD-10-CM

## 2021-12-20 NOTE — Progress Notes (Signed)
? ? ?Patient ID: Judy Jenkins, female   DOB: 02-10-1935, 87 y.o.   MRN: [id number] ? ?Chief Complaint  ?Patient presents with  ? Establish Care  ?  Referred by ?Dr Corky Sox  ? ? ?HPI ?Judy Jenkins is a 87 y.o. female.  I am asked to see the patient by Dr. Corky Sox for evaluation of bilateral lower extremity pain and swelling.  This has been going on for some time.  It started over a year ago.  Has been gradually worsening.  No clear inciting event or causative factor.  No previous history of DVT or superficial thrombophlebitis.  No chest pain or shortness of breath.  The patient reports trying to elevate them and wearing compression stockings regularly but her symptoms continue to progress.  No open wounds or ulceration.  No weeping skin.  Patient denies typical claudication symptoms but do say her legs get heavy when she walks.   ? ? ?Past Medical History:  ?Diagnosis Date  ? Cancer [facility]   ? Tachycardia   ? ? ?Past Surgical History:  ?Procedure Laterality Date  ? ABDOMINAL HYSTERECTOMY    ? BREAST SURGERY    ? CHOLECYSTECTOMY    ? ? ? ?Family History  ?Problem Relation Age of Onset  ? CAD Mother   ? CAD Father   ?No bleeding or clotting disorders ? ? ?Social History  ? ?Tobacco Use  ? Smoking status: Never  ? Smokeless tobacco: Never  ?Substance Use Topics  ? Alcohol use: No  ? Drug use: No  ? ? ? ?Allergies  ?Allergen Reactions  ? Avelox [Moxifloxacin Hcl In Nacl] Hives and Nausea And Vomiting  ? Ciprofloxacin Hives  ? Biaxin [Clarithromycin] Hives and Nausea And Vomiting  ? Atorvastatin Other (See Comments)  ?  Myalgia ?  ? Codeine Other (See Comments)  ?  Headaches  ? Sulfa Antibiotics Rash  ? ? ?Current Outpatient Medications  ?Medication Sig Dispense Refill  ? amitriptyline (ELAVIL) 10 MG tablet Take 10 mg by mouth at bedtime.    ? Biotin 1 MG CAPS Take by mouth.    ? Cholecalciferol 25 MCG (1000 UT) capsule Take by mouth.    ? fludrocortisone (FLORINEF) 0.1 MG tablet fludrocortisone 0.1 mg tablet     ? furosemide (LASIX) 20 MG tablet Take 20 mg by mouth daily.    ? letrozole (FEMARA) 2.5 MG tablet     ? levothyroxine (SYNTHROID, LEVOTHROID) 50 MCG tablet Take 50 mcg by mouth daily before breakfast.    ? metoprolol succinate (TOPROL-XL) 25 MG 24 hr tablet Take 25 mg by mouth daily.    ? omeprazole (PRILOSEC OTC) 20 MG tablet Prilosec 20 mg capsule,delayed release ? Take 1 capsule every day by oral route.    ? simvastatin (ZOCOR) 20 MG tablet Take 20 mg by mouth daily.    ? Vitamin E 268 MG (400 UNIT) CAPS Take by mouth.    ? ?No current facility-administered medications for this visit.  ? ? ? ? ?REVIEW OF SYSTEMS (Negative unless checked) ? ?Constitutional: '[]'$ Weight loss  '[]'$ Fever  '[]'$ Chills ?Cardiac: '[]'$ Chest pain   '[]'$ Chest pressure   '[x]'$ Palpitations   '[]'$ Shortness of breath when laying flat   '[]'$ Shortness of breath at rest   '[]'$ Shortness of breath with exertion. ?Vascular:  '[]'$ Pain in legs with walking   '[x]'$ Pain in legs at rest   '[]'$ Pain in legs when laying flat   '[]'$ Claudication   '[x]'$ Pain in feet when walking  '[x]'$ Pain in feet at  rest  '[]'$ Pain in feet when laying flat   '[]'$ History of DVT   '[]'$ Phlebitis   '[x]'$ Swelling in legs   '[]'$ Varicose veins   '[]'$ Non-healing ulcers ?Pulmonary:   '[]'$ Uses home oxygen   '[]'$ Productive cough   '[]'$ Hemoptysis   '[]'$ Wheeze  '[]'$ COPD   '[]'$ Asthma ?Neurologic:  '[]'$ Dizziness  '[]'$ Blackouts   '[]'$ Seizures   '[]'$ History of stroke   '[]'$ History of TIA  '[]'$ Aphasia   '[]'$ Temporary blindness   '[]'$ Dysphagia   '[]'$ Weakness or numbness in arms   '[]'$ Weakness or numbness in legs ?Musculoskeletal:  '[]'$ Arthritis   '[]'$ Joint swelling   '[]'$ Joint pain   '[]'$ Low back pain ?Hematologic:  '[]'$ Easy bruising  '[]'$ Easy bleeding   '[]'$ Hypercoagulable state   '[]'$ Anemic  '[]'$ Hepatitis ?Gastrointestinal:  '[]'$ Blood in stool   '[]'$ Vomiting blood  '[]'$ Gastroesophageal reflux/heartburn   '[]'$ Abdominal pain ?Genitourinary:  '[]'$ Chronic kidney disease   '[]'$ Difficult urination  '[]'$ Frequent urination  '[]'$ Burning with urination   '[]'$ Hematuria ?Skin:  '[]'$ Rashes   '[]'$ Ulcers    '[]'$ Wounds ?Psychological:  '[]'$ History of anxiety   '[]'$  History of major depression. ? ? ? ?Physical Exam ?BP (!) 148/75 (BP Location: Left Arm)   Pulse 84   Resp 16   Ht '5\' 7"'$  (1.702 m)   Wt 130 lb (59 kg)   BMI 20.36 kg/m?  ?Gen:  WD/WN, NAD. Appears younger than stated age. ?Head: [place]/AT, No temporalis wasting.  ?Ear/Nose/Throat: Hearing grossly intact, nares w/o erythema or drainage, oropharynx w/o Erythema/Exudate ?Eyes: Conjunctiva clear, sclera non-icteric  ?Neck: trachea midline.  No JVD.  ?Pulmonary:  Good air movement, respirations not labored, no use of accessory muscles  ?Cardiac: RRR, no JVD ?Vascular:  ?Vessel Right Left  ?Radial Palpable Palpable  ?    ?    ?    ?    ?    ?    ?PT 1+ Trace   ?DP 1+ 1+  ? ?Gastrointestinal:. No masses, surgical incisions, or scars. ?Musculoskeletal: M/S 5/5 throughout.  Extremities without ischemic changes.  No deformity or atrophy. 1+ BLE edema. ?Neurologic: Sensation grossly intact in extremities.  Symmetrical.  Speech is fluent. Motor exam as listed above. ?Psychiatric: Judgment intact, Mood & affect appropriate for pt's clinical situation. ?Dermatologic: No rashes or ulcers noted.  No cellulitis or open wounds. ? ? ? ?Radiology ?No results found. ? ?Labs ?Recent Results (from the past 2160 hour(s))  ?Brain natriuretic peptide     Status: Abnormal  ? Collection Time: 11/16/21  9:57 AM  ?Result Value Ref Range  ? B Natriuretic Peptide 100.6 (H) 0.0 - 100.0 pg/mL  ?  Comment: Performed at [facility], [address], [place], [place] [address]  ? ? ?Assessment/Plan: ? ?Pain in limb ? Recommend: ? ?The patient has atypical pain symptoms for pure atherosclerotic disease. However, on physical exam there is evidence of vascular disease, given the diminished pulses and the edema associated with venous changes of the legs. ? ?Further investigation of the patient's vascular disease is necessary to determine the relationship of the patient's lower extremity  symptoms and the degree of vascular disease. ? ?Noninvasive studies of the will be obtained and the patient will follow up with me to review these studies. ? ?I suspect the patient is c/o pseudoclaudication.  Patient should have an evaluation of his LS spine which I defer to either the primary service or the Spine service. ? ?The patient should continue walking and begin a more formal exercise program. ?The patient should continue his antiplatelet therapy and aggressive treatment of the lipid  abnormalities. ? ? ?Swelling of limb ?Recommend: ? ?I have had a long discussion with the patient regarding swelling and why it  causes symptoms.  Patient will begin wearing graduated compression on a daily basis a prescription was given. The patient will  wear the stockings first thing in the morning and removing them in the evening. The patient is instructed specifically not to sleep in the stockings.  ? ?In addition, behavioral modification will be initiated.  This will include frequent elevation, use of over the counter pain medications and exercise such as walking. ? ?Consideration for a lymph pump will also be made based upon the effectiveness of conservative therapy.  This would help to improve the edema control and prevent sequela such as ulcers and infections  ? ?Patient should undergo duplex ultrasound of the venous system to ensure that DVT or reflux is not present. ? ?The patient will follow-up with me after the ultrasound.  ? ? ?Breast cancer ([place]) ?Previously treated. ? ? ? ? ? ?Leotis Pain ?12/22/2021, 8:25 AM ? ? ?This note was created with Dragon medical transcription system.  Any errors from dictation are unintentional.    ?

## 2021-12-22 DIAGNOSIS — C50919 Malignant neoplasm of unspecified site of unspecified female breast: Secondary | ICD-10-CM | POA: Insufficient documentation

## 2021-12-22 DIAGNOSIS — M79609 Pain in unspecified limb: Secondary | ICD-10-CM | POA: Insufficient documentation

## 2021-12-22 DIAGNOSIS — M7989 Other specified soft tissue disorders: Secondary | ICD-10-CM | POA: Insufficient documentation

## 2021-12-22 NOTE — Assessment & Plan Note (Signed)
Recommend:  The patient has atypical pain symptoms for pure atherosclerotic disease. However, on physical exam there is evidence of vascular disease, given the diminished pulses and the edema associated with venous changes of the legs.  Further investigation of the patient's vascular disease is necessary to determine the relationship of the patient's lower extremity symptoms and the degree of vascular disease.  Noninvasive studies of the will be obtained and the patient will follow up with me to review these studies.  I suspect the patient is c/o pseudoclaudication.  Patient should have an evaluation of his LS spine which I defer to either the primary service or the Spine service.  The patient should continue walking and begin a more formal exercise program. The patient should continue his antiplatelet therapy and aggressive treatment of the lipid abnormalities. 

## 2021-12-22 NOTE — Assessment & Plan Note (Signed)

## 2021-12-22 NOTE — Assessment & Plan Note (Signed)
Previously treated. ?

## 2022-01-17 ENCOUNTER — Emergency Department
Admission: EM | Admit: 2022-01-17 | Discharge: 2022-01-17 | Disposition: A | Discharge status: Home / Self Care | Payer: Medicare HMO | Attending: Emergency Medicine | Admitting: Emergency Medicine

## 2022-01-17 ENCOUNTER — Other Ambulatory Visit: Payer: Self-pay

## 2022-01-17 ENCOUNTER — Emergency Department: Payer: Medicare HMO

## 2022-01-17 DIAGNOSIS — S20222A Contusion of left back wall of thorax, initial encounter: Secondary | ICD-10-CM | POA: Insufficient documentation

## 2022-01-17 DIAGNOSIS — R55 Syncope and collapse: Secondary | ICD-10-CM | POA: Insufficient documentation

## 2022-01-17 DIAGNOSIS — Y9301 Activity, walking, marching and hiking: Secondary | ICD-10-CM | POA: Diagnosis not present

## 2022-01-17 DIAGNOSIS — Z853 Personal history of malignant neoplasm of breast: Secondary | ICD-10-CM | POA: Diagnosis not present

## 2022-01-17 DIAGNOSIS — W1839XA Other fall on same level, initial encounter: Secondary | ICD-10-CM | POA: Diagnosis not present

## 2022-01-17 DIAGNOSIS — E039 Hypothyroidism, unspecified: Secondary | ICD-10-CM | POA: Insufficient documentation

## 2022-01-17 DIAGNOSIS — Y92008 Other place in unspecified non-institutional (private) residence as the place of occurrence of the external cause: Secondary | ICD-10-CM | POA: Diagnosis not present

## 2022-01-17 DIAGNOSIS — I10 Essential (primary) hypertension: Secondary | ICD-10-CM | POA: Diagnosis not present

## 2022-01-17 DIAGNOSIS — R531 Weakness: Secondary | ICD-10-CM | POA: Diagnosis not present

## 2022-01-17 DIAGNOSIS — S299XXA Unspecified injury of thorax, initial encounter: Secondary | ICD-10-CM | POA: Diagnosis present

## 2022-01-17 DIAGNOSIS — S20229A Contusion of unspecified back wall of thorax, initial encounter: Secondary | ICD-10-CM

## 2022-01-17 LAB — URINALYSIS, ROUTINE W REFLEX MICROSCOPIC
Bilirubin Urine: NEGATIVE
Glucose, UA: NEGATIVE mg/dL
Hgb urine dipstick: NEGATIVE
Ketones, ur: NEGATIVE mg/dL
Nitrite: NEGATIVE
Protein, ur: NEGATIVE mg/dL
Specific Gravity, Urine: 1.012 (ref 1.005–1.030)
Squamous Epithelial / HPF: NONE SEEN (ref 0–5)
pH: 6 (ref 5.0–8.0)

## 2022-01-17 LAB — BASIC METABOLIC PANEL
Anion gap: 6 (ref 5–15)
BUN: 13 mg/dL (ref 8–23)
CO2: 28 mmol/L (ref 22–32)
Calcium: 8.8 mg/dL — ABNORMAL LOW (ref 8.9–10.3)
Chloride: 101 mmol/L (ref 98–111)
Creatinine, Ser: 0.91 mg/dL (ref 0.44–1.00)
GFR, Estimated: >60 mL/min (ref >60)
Glucose, Bld: 110 mg/dL — ABNORMAL HIGH (ref 70–99)
Potassium: 4.4 mmol/L (ref 3.5–5.1)
Sodium: 135 mmol/L (ref 135–145)

## 2022-01-17 LAB — CBC
HCT: 39.9 % (ref 36.0–46.0)
Hemoglobin: 12.8 g/dL (ref 12.0–15.0)
MCH: 29.7 pg (ref 26.0–34.0)
MCHC: 32.1 g/dL (ref 30.0–36.0)
MCV: 92.6 fL (ref 80.0–100.0)
Platelets: 281 10*3/uL (ref 150–400)
RBC: 4.31 MIL/uL (ref 3.87–5.11)
RDW: 12.8 % (ref 11.5–15.5)
WBC: 6.8 10*3/uL (ref 4.0–10.5)
nRBC: 0 % (ref 0.0–0.2)

## 2022-01-17 LAB — TROPONIN I (HIGH SENSITIVITY): Troponin I (High Sensitivity): 4 ng/L (ref <18)

## 2022-01-17 LAB — CBG MONITORING, ED: Glucose-Capillary: 75 mg/dL (ref 70–99)

## 2022-01-17 MED ORDER — TRAMADOL HCL 50 MG PO TABS: 50.0000 mg | ORAL_TABLET | Freq: Four times a day (QID) | ORAL | 0 refills | Status: AC | PRN

## 2022-01-17 NOTE — ED Notes (Signed)
Pt up to bedside toilet. Gait slow but steady. Husband with pt for assistance. Urine cup provided for u/a.

## 2022-01-17 NOTE — ED Notes (Signed)
Registration at the bedside.

## 2022-01-17 NOTE — ED Notes (Signed)
Dr. Cherylann Banas at the bedside for pt evaluation. Pt husband at the bedside. Pt alert and able to answer questions appropriately.

## 2022-01-17 NOTE — Discharge Instructions (Signed)
You possibly have a very small fracture to the right seventh rib in the back, and also a contusion to the upper back from your fall.  You should take over-the-counter Tylenol as needed for pain, but can take the prescribed tramadol if needed for more severe pain.  Make sure that you are taking deep breaths repeatedly several times throughout the day to keep your lungs open.  Follow-up with your regular doctor and cardiologist.  Return to the ER for new, worsening, or persistent severe pain, shortness of breath, cough, weakness or lightheadedness, recurrent episodes of passing out, or any other new or worsening symptoms that concern you.

## 2022-01-17 NOTE — ED Notes (Addendum)
Pt to CT

## 2022-01-17 NOTE — ED Notes (Signed)
MD aware of CBG. Pt given orange juice, graham cracker, and peanut butter. Husband at the bedside.

## 2022-01-17 NOTE — ED Provider Notes (Signed)
[facility] Provider Note    Event Date/Time   First MD Initiated Contact with Patient 01/17/22 0820     (approximate)   History   Loss of Consciousness and Back Pain   HPI  Judy Jenkins is a 87 y.o. female  with a history of breast cancer status postlumpectomy, fibromyalgia, hypertension, hyperlipidemia and hypothyroidism who presents with back pain after a fall few days ago.  The patient reports the pain in her upper back mainly on the left side after she fell flat onto the back.  She did not hit her head and denies other injuries.  The patient states that she was walking down the hall and suddenly felt like she was about to pass out and fell.  She states that she has had a longstanding history of orthostatic dizziness, but ever since she was put on Florinef several months ago this has markedly improved.  These episodes were very brief.  However, the patient states that she still has a generalized weakness when she is up and about that resolves when she lies down; this is different than the orthostatic symptoms in that this weakness lasts longer.  This has been going on for approximately the last month.  The patient denies headache, chest pain, difficulty breathing, vomiting, or other acute symptoms.    Physical Exam   Triage Vital Signs: ED Triage Vitals  Enc Vitals Group     BP      Pulse      Resp      Temp      Temp src      SpO2      Weight      Height      Head Circumference      Peak Flow      Pain Score      Pain Loc      Pain Edu?      Excl. in [place]?     Most recent vital signs: Vitals:   01/17/22 1041 01/17/22 1205  BP: (!) 147/67 126/64  Pulse: 67 89  Resp: 18 20  Temp:  97.7 F (36.5 C)  SpO2: 98% 97%     General: Alert and oriented, well-appearing. CV:  Good peripheral perfusion.  Resp:  Normal effort.  Abd:  No distention.  Other:  No midline spinal tenderness.  Left thoracic paraspinal and posterior rib  tenderness.  Motor intact in all extremities.  Normal coordination.   ED Results / Procedures / Treatments   Labs (all labs ordered are listed, but only abnormal results are displayed) Labs Reviewed  BASIC METABOLIC PANEL - Abnormal; Notable for the following components:      Result Value   Glucose, Bld 110 (*)    Calcium 8.8 (*)    All other components within normal limits  URINALYSIS, ROUTINE W REFLEX MICROSCOPIC - Abnormal; Notable for the following components:   Color, Urine YELLOW (*)    APPearance CLEAR (*)    Leukocytes,Ua LARGE (*)    Bacteria, UA RARE (*)    All other components within normal limits  CBC  CBG MONITORING, ED  TROPONIN I (HIGH SENSITIVITY)     EKG  ED ECG REPORT I, Arta Silence, the attending physician, personally viewed and interpreted this ECG.  Date: 01/17/2022 EKG Time: 0826 Rate: 72 Rhythm: normal sinus rhythm QRS Axis: Left axis Intervals: normal ST/T Wave abnormalities: normal Narrative Interpretation: no evidence of acute ischemia; no significant change when compared to  EKG of 01/21/2019    RADIOLOGY  XR R ribs/chest: I independently viewed and interpreted the images; there is no acute fracture or any focal consolidation or edema  CT thoracic spine: No acute spinal fracture; possible right seventh rib nondisplaced fracture  PROCEDURES:  Critical Care performed: No  Procedures   MEDICATIONS ORDERED IN ED: Medications - No data to display   IMPRESSION / MDM / [place] / ED COURSE  I reviewed the triage vital signs and the nursing notes.  87 year old female with PMH as noted above presents with back pain after a fall 2 days ago which she states happened when she suddenly got dizzy and nearly passed out.  I reviewed the past medical records.  The patient was recently evaluated by vascular surgery for leg swelling and pain, with concern for likely pseudoclaudication.  She is also followed by cardiology and was  being treated with midodrine for orthostatic hypotension, more recently switched to Florinef.  She had a negative echocardiogram in February.  I attempted to tease out the patient's description of her lightheadedness and weakness symptoms.  It seems that she had brief but intense orthostatic symptoms for a long time which have mostly resolved since she was put on Florinef several months ago.  However she also reports a sensation of generalized weakness when she is up and about that is more longstanding and resolves when she is sitting down, and this has been going on for about a month.  In terms of the back pain, differential diagnosis includes, but is not limited to, contusion, thoracic spine fracture, posterior rib fracture.  We will obtain CT of the thoracic spine and x-rays of the ribs on the left.  For the syncopal episode, differential includes continued orthostatic symptoms, cardiac arrhythmia, dehydration/hypovolemia, electrolyte abnormality, other metabolic cause.  We will obtain lab work-up including cardiac enzymes.  There is no indication for brain imaging.  Given that it has been 2 days since this happened with no recurrence of the symptoms, if the ED work-up is negative the patient likely will not require admission.  Patient's presentation is most consistent with acute presentation with potential threat to life or bodily function.  The patient is on the cardiac monitor to evaluate for evidence of arrhythmia and/or significant heart rate changes.  ----------------------------------------- 11:42 AM on 01/17/2022 -----------------------------------------  CT is negative for acute findings other than a possible right posterior seventh rib fracture which is not really where the patient is having her acute pain.  The rib x-rays on the left are negative. Lab work-up is unremarkable showing no specific findings to explain the patient's orthostatic symptoms and weakness.  Urinalysis is  negative.  Electrolytes are normal.  The patient is not anemic.  The troponin is negative and given the time course of the symptoms there is no indication for repeat.  On reassessment, the patient appears comfortable.  She is relieved by the negative work-up.  She feels well and would like to go home.  I did consider whether the patient would warrant admission given the recent fall and possible near syncope, however given her stable vital signs, negative work-up, and the duration of time since the near syncopal event, it is reasonable for her to go home and follow-up as an outpatient.  I have prescribed a course of tramadol to augment OTC medications.  I counseled the patient on the results of the work-up and gave her thorough return precautions; she expresses understanding.   FINAL CLINICAL IMPRESSION(S) /  ED DIAGNOSES   Final diagnoses:  Near syncope  Contusion of back, unspecified laterality, initial encounter     Rx / DC Orders   ED Discharge Orders          Ordered    traMADol (ULTRAM) 50 MG tablet  Every 6 hours PRN        01/17/22 1140             Note:  This document was prepared using Dragon voice recognition software and may include unintentional dictation errors.    Arta Silence, MD 01/17/22 [phone]

## 2022-01-17 NOTE — ED Notes (Signed)
MD at bedside to discuss plan of care

## 2022-01-17 NOTE — ED Triage Notes (Signed)
Pt states she passed out Sunday and fell back injuring her mid/upper back . Denies any other injury. Pt is a/ox4 on arrival.

## 2022-01-17 NOTE — ED Notes (Signed)
Pt A&O, IV removed, pt given discharge instructions, pt ambulating steady with assistance.

## 2022-01-27 ENCOUNTER — Ambulatory Visit (INDEPENDENT_AMBULATORY_CARE_PROVIDER_SITE_OTHER): Payer: Medicare HMO

## 2022-01-27 ENCOUNTER — Ambulatory Visit (INDEPENDENT_AMBULATORY_CARE_PROVIDER_SITE_OTHER): Payer: Medicare HMO | Admitting: Vascular Surgery

## 2022-01-27 ENCOUNTER — Encounter (INDEPENDENT_AMBULATORY_CARE_PROVIDER_SITE_OTHER): Payer: Self-pay | Admitting: Vascular Surgery

## 2022-01-27 VITALS — BP 119/73 | HR 88 | Resp 16 | Wt 130.4 lb

## 2022-01-27 DIAGNOSIS — M79604 Pain in right leg: Secondary | ICD-10-CM | POA: Diagnosis not present

## 2022-01-27 DIAGNOSIS — M79605 Pain in left leg: Secondary | ICD-10-CM

## 2022-01-27 DIAGNOSIS — M7989 Other specified soft tissue disorders: Secondary | ICD-10-CM

## 2022-01-27 NOTE — Progress Notes (Signed)
MRN : [id number]  Judy Jenkins is a 87 y.o. ([birth year]) female who presents with chief complaint of  Chief Complaint  Patient presents with   Follow-up    Ultrasound follow up  .  History of Present Illness: Patient returns today in follow up of her leg pain and swelling. No changes from her visit about a month ago other than some mild improvement in her swelling.  Her cardiologist did just start her on some Lasix as well.  Her noninvasive studies today demonstrate no evidence of DVT or superficial thrombophlebitis with no significant venous reflux in either lower extremity.  Her ABIs are 1.08 on the right and 1.14 on the left with triphasic waveforms and normal digital pressures bilaterally.  Current Outpatient Medications  Medication Sig Dispense Refill   amitriptyline (ELAVIL) 10 MG tablet Take 10 mg by mouth at bedtime.     Biotin 1 MG CAPS Take by mouth.     Cholecalciferol 25 MCG (1000 UT) capsule Take by mouth.     fludrocortisone (FLORINEF) 0.1 MG tablet fludrocortisone 0.1 mg tablet     furosemide (LASIX) 20 MG tablet Take 20 mg by mouth daily.     letrozole (FEMARA) 2.5 MG tablet      levothyroxine (SYNTHROID, LEVOTHROID) 50 MCG tablet Take 50 mcg by mouth daily before breakfast.     metoprolol succinate (TOPROL-XL) 25 MG 24 hr tablet Take 25 mg by mouth daily.     omeprazole (PRILOSEC OTC) 20 MG tablet Prilosec 20 mg capsule,delayed release  Take 1 capsule every day by oral route.     simvastatin (ZOCOR) 20 MG tablet Take 20 mg by mouth daily.     Vitamin E 268 MG (400 UNIT) CAPS Take by mouth.     No current facility-administered medications for this visit.    Past Medical History:  Diagnosis Date   Cancer ([place])    Tachycardia     Past Surgical History:  Procedure Laterality Date   ABDOMINAL HYSTERECTOMY     BREAST SURGERY     CHOLECYSTECTOMY       Social History   Tobacco Use   Smoking status: Never   Smokeless tobacco: Never  Substance Use  Topics   Alcohol use: No   Drug use: No       Family History  Problem Relation Age of Onset   CAD Mother    CAD Father      Allergies  Allergen Reactions   Avelox [Moxifloxacin Hcl In Nacl] Hives and Nausea And Vomiting   Ciprofloxacin Hives   Biaxin [Clarithromycin] Hives and Nausea And Vomiting   Atorvastatin Other (See Comments)    Myalgia    Codeine Other (See Comments)    Headaches   Sulfa Antibiotics Rash     REVIEW OF SYSTEMS (Negative unless checked)  Constitutional: '[]'$ Weight loss  '[]'$ Fever  '[]'$ Chills Cardiac: '[]'$ Chest pain   '[]'$ Chest pressure   '[x]'$ Palpitations   '[]'$ Shortness of breath when laying flat   '[]'$ Shortness of breath at rest   '[]'$ Shortness of breath with exertion. Vascular:  '[]'$ Pain in legs with walking   '[x]'$ Pain in legs at rest   '[]'$ Pain in legs when laying flat   '[]'$ Claudication   '[x]'$ Pain in feet when walking  '[x]'$ Pain in feet at rest  '[]'$ Pain in feet when laying flat   '[]'$ History of DVT   '[]'$ Phlebitis   '[x]'$ Swelling in legs   '[]'$ Varicose veins   '[]'$ Non-healing ulcers Pulmonary:   '[]'$ Uses home oxygen   '[]'$   Productive cough   '[]'$ Hemoptysis   '[]'$ Wheeze  '[]'$ COPD   '[]'$ Asthma Neurologic:  '[]'$ Dizziness  '[]'$ Blackouts   '[]'$ Seizures   '[]'$ History of stroke   '[]'$ History of TIA  '[]'$ Aphasia   '[]'$ Temporary blindness   '[]'$ Dysphagia   '[]'$ Weakness or numbness in arms   '[]'$ Weakness or numbness in legs Musculoskeletal:  '[x]'$ Arthritis   '[]'$ Joint swelling   '[]'$ Joint pain   '[]'$ Low back pain Hematologic:  '[]'$ Easy bruising  '[]'$ Easy bleeding   '[]'$ Hypercoagulable state   '[]'$ Anemic   Gastrointestinal:  '[]'$ Blood in stool   '[]'$ Vomiting blood  '[]'$ Gastroesophageal reflux/heartburn   '[]'$ Abdominal pain Genitourinary:  '[]'$ Chronic kidney disease   '[]'$ Difficult urination  '[]'$ Frequent urination  '[]'$ Burning with urination   '[]'$ Hematuria Skin:  '[]'$ Rashes   '[]'$ Ulcers   '[]'$ Wounds Psychological:  '[]'$ History of anxiety   '[]'$  History of major depression.  Physical Examination  BP 119/73 (BP Location: Left Arm)   Pulse 88   Resp 16   Wt 130 lb 6.4  oz (59.1 kg)   BMI 20.42 kg/m  Gen:  WD/WN, NAD Head: [place]/AT, No temporalis wasting. Ear/Nose/Throat: Hearing grossly intact, nares w/o erythema or drainage Eyes: Conjunctiva clear. Sclera non-icteric Neck: Supple.  Trachea midline Pulmonary:  Good air movement, no use of accessory muscles.  Cardiac: RRR, no JVD Vascular:  Vessel Right Left  Radial Palpable Palpable                          PT 1+ palpable 1+ palpable  DP 1+ palpable 1+ palpable   Gastrointestinal: soft, non-tender/non-distended. No guarding/reflex.  Musculoskeletal: M/S 5/5 throughout.  No deformity or atrophy.  1+ bilateral lower extremity edema. Neurologic: Sensation grossly intact in extremities.  Symmetrical.  Speech is fluent.  Psychiatric: Judgment intact, Mood & affect appropriate for pt's clinical situation. Dermatologic: No rashes or ulcers noted.  No cellulitis or open wounds.      Labs Recent Results (from the past 2160 hour(s))  Brain natriuretic peptide     Status: Abnormal   Collection Time: 11/16/21  9:57 AM  Result Value Ref Range   B Natriuretic Peptide 100.6 (H) 0.0 - 100.0 pg/mL    Comment: Performed at [facility], [address] [place], [place] [address]  Basic metabolic panel     Status: Abnormal   Collection Time: 01/17/22  9:15 AM  Result Value Ref Range   Sodium 135 135 - 145 mmol/L   Potassium 4.4 3.5 - 5.1 mmol/L   Chloride 101 98 - 111 mmol/L   CO2 28 22 - 32 mmol/L   Glucose, Bld 110 (H) 70 - 99 mg/dL    Comment: Glucose reference range applies only to samples taken after fasting for at least 8 hours.   BUN 13 8 - 23 mg/dL   Creatinine, Ser 0.91 0.44 - 1.00 mg/dL   Calcium 8.8 (L) 8.9 - 10.3 mg/dL   GFR, Estimated >60 >60 mL/min    Comment: (NOTE) Calculated using the CKD-EPI Creatinine Equation (2021)    Anion gap 6 5 - 15    Comment: Performed at [facility], [address] [place], [place] [address]  CBC     Status: None   Collection  Time: 01/17/22  9:15 AM  Result Value Ref Range   WBC 6.8 4.0 - 10.5 K/uL   RBC 4.31 3.87 - 5.11 MIL/uL   Hemoglobin 12.8 12.0 - 15.0 g/dL   HCT 39.9 36.0 - 46.0 %   MCV 92.6 80.0 -  100.0 fL   MCH 29.7 26.0 - 34.0 pg   MCHC 32.1 30.0 - 36.0 g/dL   RDW 12.8 11.5 - 15.5 %   Platelets 281 150 - 400 K/uL   nRBC 0.0 0.0 - 0.2 %    Comment: Performed at [facility], [address] [place], [place] [address]  Urinalysis, Routine w reflex microscopic     Status: Abnormal   Collection Time: 01/17/22  9:15 AM  Result Value Ref Range   Color, Urine YELLOW (A) YELLOW   APPearance CLEAR (A) CLEAR   Specific Gravity, Urine 1.012 1.005 - 1.030   pH 6.0 5.0 - 8.0   Glucose, UA NEGATIVE NEGATIVE mg/dL   Hgb urine dipstick NEGATIVE NEGATIVE   Bilirubin Urine NEGATIVE NEGATIVE   Ketones, ur NEGATIVE NEGATIVE mg/dL   Protein, ur NEGATIVE NEGATIVE mg/dL   Nitrite NEGATIVE NEGATIVE   Leukocytes,Ua LARGE (A) NEGATIVE   RBC / HPF 0-5 0 - 5 RBC/hpf   WBC, UA 0-5 0 - 5 WBC/hpf   Bacteria, UA RARE (A) NONE SEEN   Squamous Epithelial / LPF NONE SEEN 0 - 5   Mucus PRESENT     Comment: Performed at [facility], [place], [place] [address]  Troponin I (High Sensitivity)     Status: None   Collection Time: 01/17/22  9:15 AM  Result Value Ref Range   Troponin I (High Sensitivity) 4 <18 ng/L    Comment: (NOTE) Elevated high sensitivity troponin I (hsTnI) values and significant  changes across serial measurements may suggest ACS but many other  chronic and acute conditions are known to elevate hsTnI results.  Refer to the "Links" section for chest pain algorithms and additional  guidance. Performed at [facility], [address] [place], [place] [address]   CBG monitoring, ED     Status: None   Collection Time: 01/17/22 10:18 AM  Result Value Ref Range   Glucose-Capillary 75 70 - 99 mg/dL    Comment: Glucose reference range applies only to samples  taken after fasting for at least 8 hours.    Radiology DG Ribs Unilateral W/Chest Left  Result Date: 01/17/2022 CLINICAL DATA:  Left rib pain after fall. EXAM: LEFT RIBS AND CHEST - 3+ VIEW COMPARISON:  January 20, 2021. FINDINGS: No fracture or other bone lesions are seen involving the ribs. There is no evidence of pneumothorax or pleural effusion. Both lungs are clear. Heart size and mediastinal contours are within normal limits. IMPRESSION: Negative. Electronically Signed   By: Marijo Conception M.D.   On: 01/17/2022 09:16   CT Thoracic Spine Wo Contrast  Result Date: 01/17/2022 CLINICAL DATA:  87 year old female status post fall 2 days ago injuring upper back. Pain. EXAM: CT THORACIC SPINE WITHOUT CONTRAST TECHNIQUE: Multidetector CT images of the thoracic were obtained using the standard protocol without intravenous contrast. RADIATION DOSE REDUCTION: This exam was performed according to the departmental dose-optimization program which includes automated exposure control, adjustment of the mA and/or kV according to patient size and/or use of iterative reconstruction technique. COMPARISON:  Two-view chest radiographs 01/20/2021. FINDINGS: Limited cervical spine imaging: Cervicothoracic junction alignment is within normal limits. C6-C7 disc space loss with endplate spurring. Thoracic spine segmentation:  Normal. Alignment: Relatively preserved thoracic kyphosis. There is mild grade 1 anterolisthesis of T3 on T4. Vertebrae: Osteopenia. No acute osseous abnormality identified. Thoracic vertebrae appear intact. No acute posterior rib fracture is identified, although there is asymmetric cortical buckling of the  posterior right 7th rib on series 4, image 75. Paraspinal and other soft tissues: Trace retained secretions along the right lateral wall of the trachea at the thoracic inlet. Subpleural and apical right lung scarring. Visible major airways remain patent. Calcified aortic atherosclerosis. Moderate size  gastric hiatal hernia. No evidence of pericardial or pleural effusion. Calcified coronary artery atherosclerosis (series 5, image 91). Cholecystectomy clips. Negative visible noncontrast upper abdominal viscera. Negative thoracic paraspinal soft tissues. Disc levels: Mild for age thoracic spine degeneration. No CT evidence of thoracic spinal stenosis. IMPRESSION: 1. Osteopenia. No acute osseous abnormality in the thoracic spine. But difficult to exclude a nondisplaced posterior right 7th rib fracture. 2. Moderate size gastric hiatal hernia. Calcified coronary artery and Aortic Atherosclerosis (ICD10-I70.0). Electronically Signed   By: Genevie Ann M.D.   On: 01/17/2022 08:59    Assessment/Plan  Pain in limb Her noninvasive studies today demonstrate no evidence of DVT or superficial thrombophlebitis with no significant venous reflux in either lower extremity.  Her ABIs are 1.08 on the right and 1.14 on the left with triphasic waveforms and normal digital pressures bilaterally.  Swelling of limb Her noninvasive studies today demonstrate no evidence of DVT or superficial thrombophlebitis with no significant venous reflux in either lower extremity.  Her ABIs are 1.08 on the right and 1.14 on the left with triphasic waveforms and normal digital pressures bilaterally. No evidence of significant arterial or venous disease.  May have a component of lymphedema and some of this may be cardiac dysfunction.  Continue compression, elevation, and return in 6 months to determine whether or not she may benefit from a lymphedema pump.    Leotis Pain, MD  01/27/2022 12:19 PM    This note was created with Dragon medical transcription system.  Any errors from dictation are purely unintentional

## 2022-01-27 NOTE — Assessment & Plan Note (Signed)
Her noninvasive studies today demonstrate no evidence of DVT or superficial thrombophlebitis with no significant venous reflux in either lower extremity.  Her ABIs are 1.08 on the right and 1.14 on the left with triphasic waveforms and normal digital pressures bilaterally. No evidence of significant arterial or venous disease.  May have a component of lymphedema and some of this may be cardiac dysfunction.  Continue compression, elevation, and return in 6 months to determine whether or not she may benefit from a lymphedema pump.

## 2022-01-27 NOTE — Assessment & Plan Note (Signed)
Her noninvasive studies today demonstrate no evidence of DVT or superficial thrombophlebitis with no significant venous reflux in either lower extremity.  Her ABIs are 1.08 on the right and 1.14 on the left with triphasic waveforms and normal digital pressures bilaterally.

## 2022-02-07 IMAGING — CT CT NECK W/ CM
3 of 5 series · 12 of 33 positions shown, 14 images · IV contrast (omnipaque)
Comparison: Neck ultrasound 10/07/2020

CLINICAL DATA: Left submandibular swelling since June 2020

EXAM:
CT NECK WITH CONTRAST
TECHNIQUE: Multidetector CT imaging of the neck was performed using the
standard protocol following the bolus administration of intravenous
contrast.
CONTRAST:  75mL OMNIPAQUE IOHEXOL 300 MG/ML  SOLN

[Series 4: coronal neck neck (person_name) 2.00 cor · coronal · 0.54mm/px · 3 of 109 slices shown]
[im 32/109  bone]
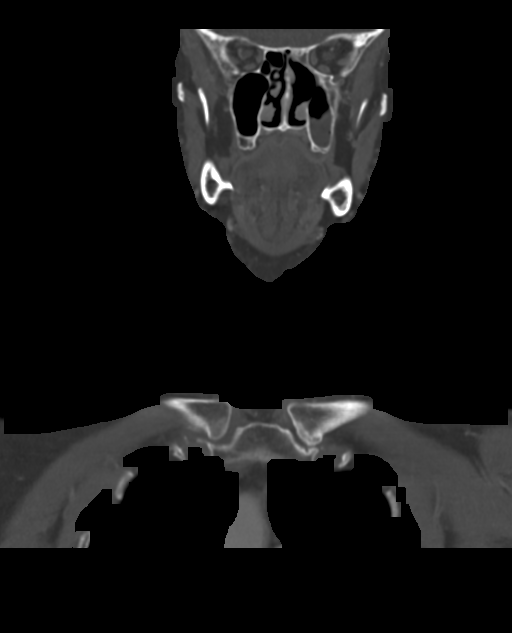
[im 47/109  bone]
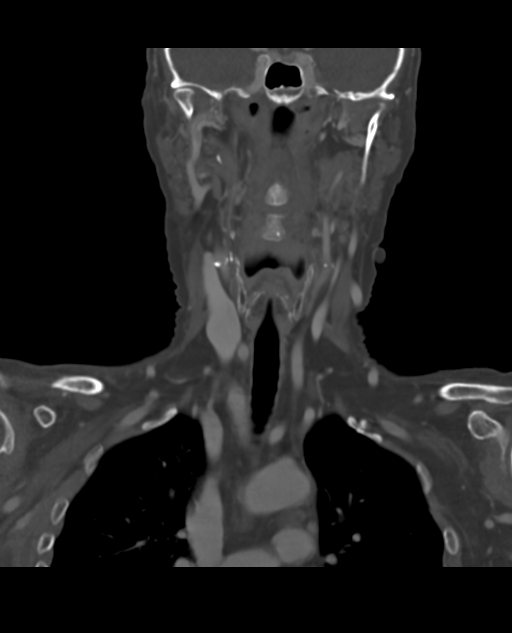
[im 62/109  bone]
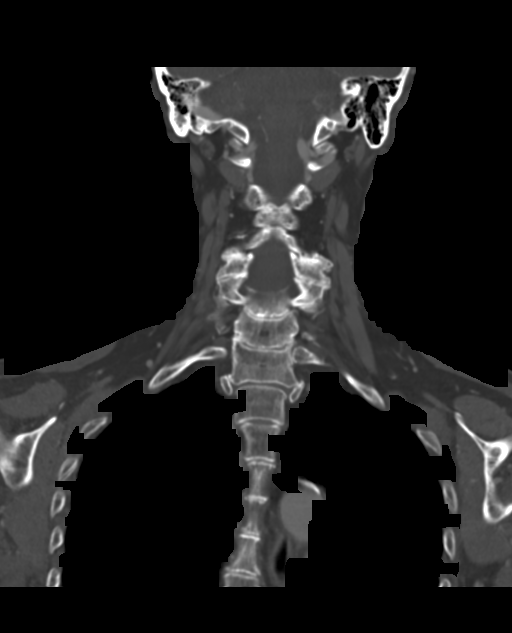

[Series 6: sagittal neck neck (person_name) 2.00 sag · sagittal · 0.43mm/px · 5 of 137 slices shown, 6 images]
[im 46/137  bone]
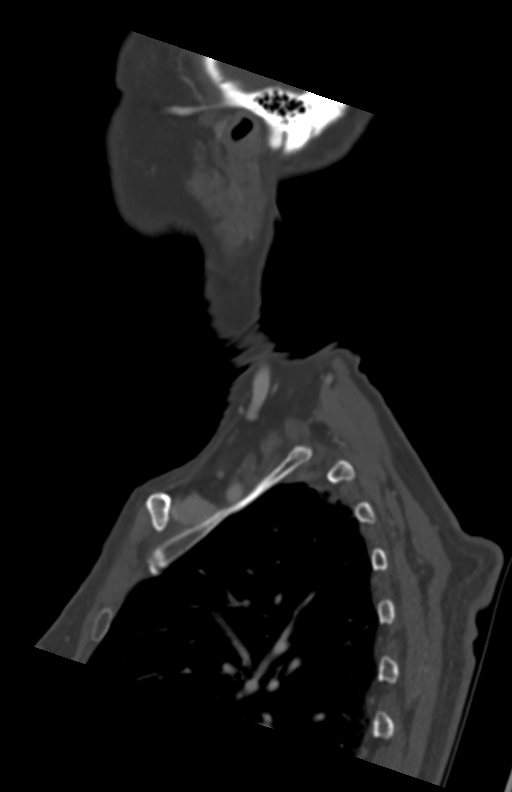
[im 57/137  bone]
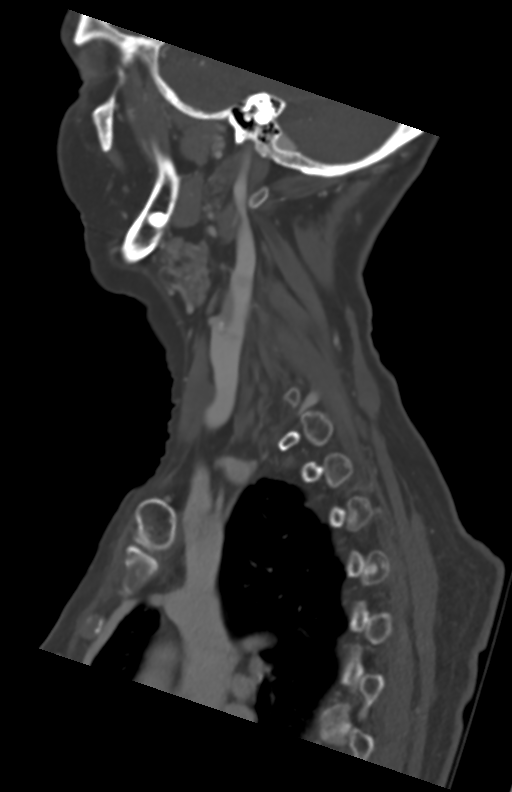
[im 69/137  soft-tissue]
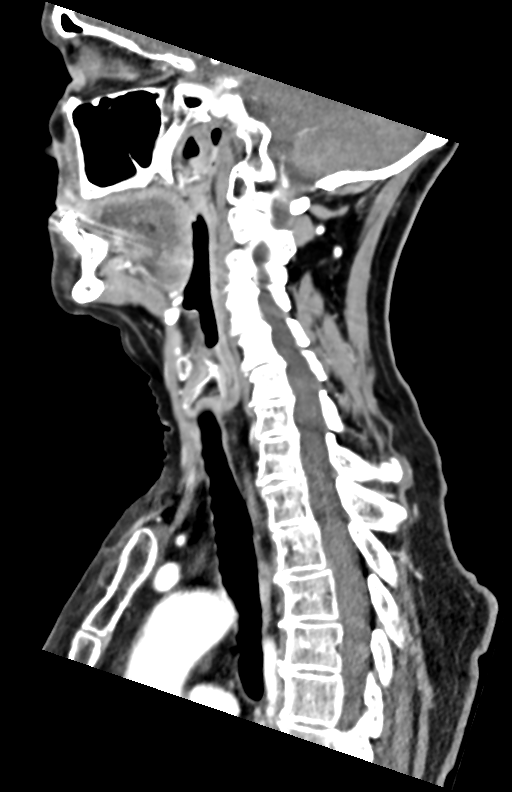
[im 69/137  bone]
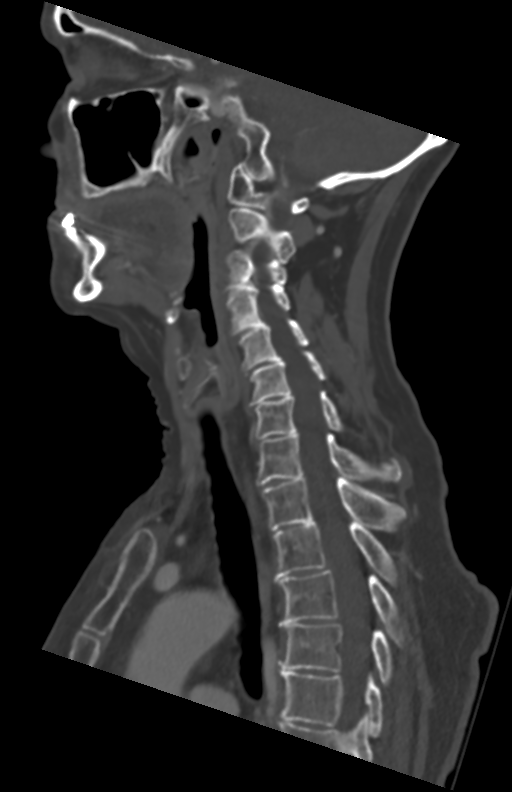
[im 80/137  bone]
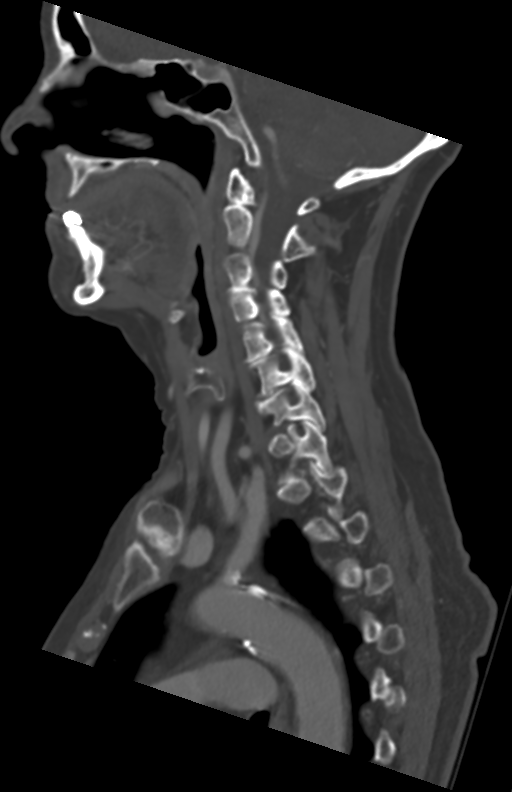
[im 91/137  bone]
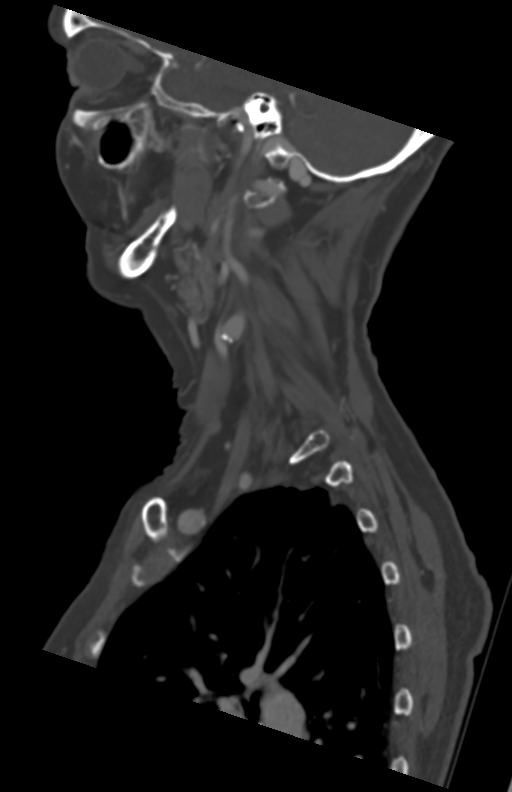

[Series 8: ax oropharynx neck neck (person_name) 2.00 ax · axial · 0.43mm/px · z∈[-833,-642]mm · 4 of 169 slices shown, 5 images]
[im 34/169  soft-tissue]
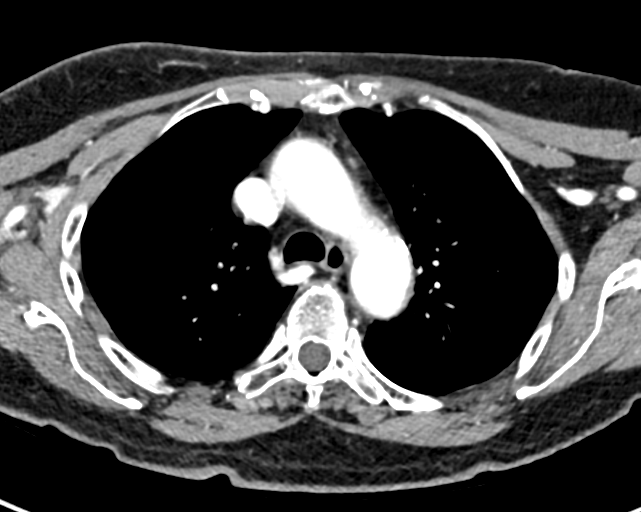
[im 34/169  bone]
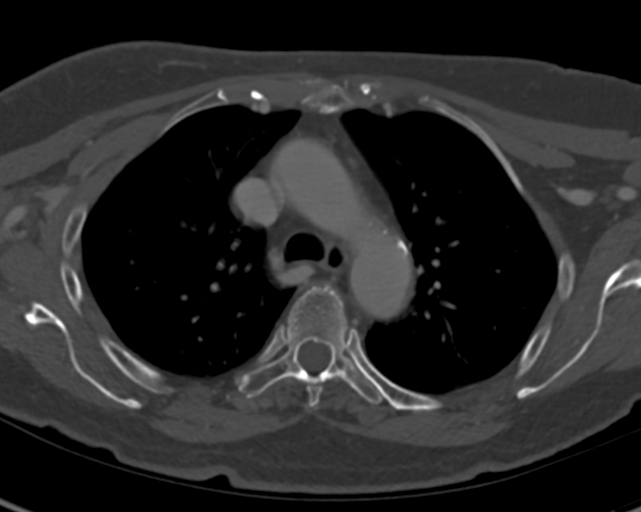
[im 68/169  bone]
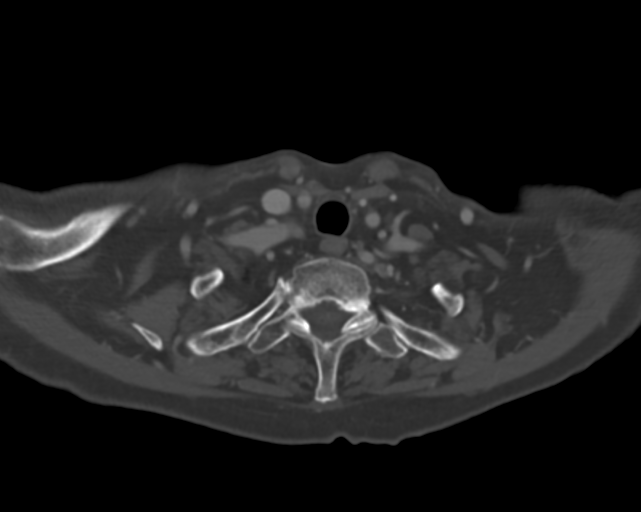
[im 101/169  bone]
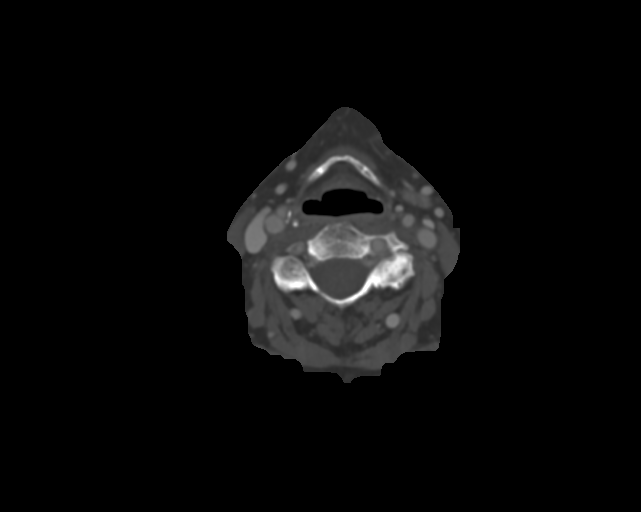
[im 135/169  bone]
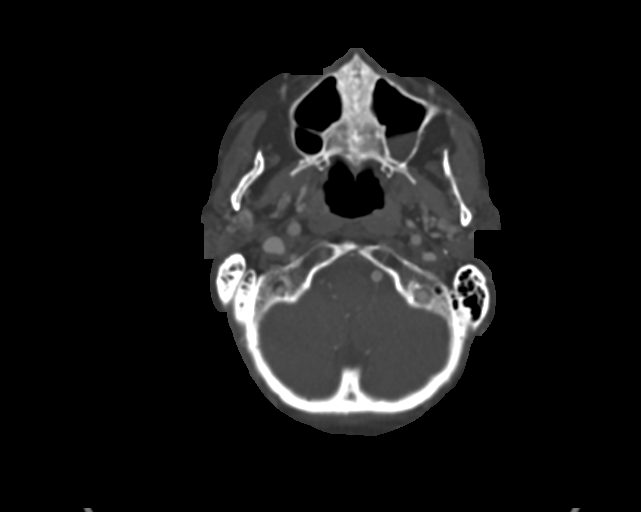

[12 of 33 positions shown; findings below may reference images not displayed]

FINDINGS: Pharynx and larynx: No evidence of mass or swelling.

Salivary glands: Symmetric submandibular gland size when accounting
for angle of imaging. No evidence of inflammation, stone, or ductal
obstruction.

Thyroid: Very atrophic.

Lymph nodes: No adenopathy or mass seen.

Vascular: Atheromatous calcification at the carotid bifurcations.
The palpable marker is in close proximity to the left carotid bulb
which shows no asymmetry.

Limited intracranial: Negative

Visualized orbits: Bilateral cataract resection

Mastoids and visualized paranasal sinuses: Partial right mastoid
opacification. Prior endoscopic sinus surgery on the left.No
significant sinus opacification

Skeleton: Cervical spine degeneration with multilevel listhesis. No
evidence of fracture or bone lesion

Upper chest: Non masslike soft tissue density with central fluid at
the right axilla. There is history of right axillary dissection a
few months prior in the visualized.
IMPRESSION: No specific cause for symptoms. No visible mass or inflammation
beneath the patient's symptom marker.

## 2022-02-12 ENCOUNTER — Emergency Department: Payer: Medicare HMO

## 2022-02-12 ENCOUNTER — Observation Stay
Admission: EM | Admit: 2022-02-12 | Discharge: 2022-02-13 | Disposition: A | Discharge status: Home / Self Care | Payer: Medicare HMO | Attending: Osteopathic Medicine | Admitting: Osteopathic Medicine

## 2022-02-12 ENCOUNTER — Other Ambulatory Visit: Payer: Self-pay

## 2022-02-12 DIAGNOSIS — R29818 Other symptoms and signs involving the nervous system: Secondary | ICD-10-CM | POA: Diagnosis not present

## 2022-02-12 DIAGNOSIS — R112 Nausea with vomiting, unspecified: Secondary | ICD-10-CM | POA: Insufficient documentation

## 2022-02-12 DIAGNOSIS — Z79899 Other long term (current) drug therapy: Secondary | ICD-10-CM | POA: Diagnosis not present

## 2022-02-12 DIAGNOSIS — Z853 Personal history of malignant neoplasm of breast: Secondary | ICD-10-CM | POA: Insufficient documentation

## 2022-02-12 DIAGNOSIS — R7989 Other specified abnormal findings of blood chemistry: Secondary | ICD-10-CM | POA: Diagnosis present

## 2022-02-12 DIAGNOSIS — K219 Gastro-esophageal reflux disease without esophagitis: Secondary | ICD-10-CM | POA: Diagnosis present

## 2022-02-12 DIAGNOSIS — I10 Essential (primary) hypertension: Secondary | ICD-10-CM | POA: Insufficient documentation

## 2022-02-12 DIAGNOSIS — C50919 Malignant neoplasm of unspecified site of unspecified female breast: Secondary | ICD-10-CM | POA: Diagnosis present

## 2022-02-12 DIAGNOSIS — I951 Orthostatic hypotension: Secondary | ICD-10-CM | POA: Diagnosis not present

## 2022-02-12 DIAGNOSIS — R531 Weakness: Secondary | ICD-10-CM | POA: Diagnosis not present

## 2022-02-12 DIAGNOSIS — R791 Abnormal coagulation profile: Secondary | ICD-10-CM | POA: Diagnosis not present

## 2022-02-12 DIAGNOSIS — R42 Dizziness and giddiness: Secondary | ICD-10-CM | POA: Diagnosis present

## 2022-02-12 DIAGNOSIS — F419 Anxiety disorder, unspecified: Secondary | ICD-10-CM | POA: Diagnosis present

## 2022-02-12 DIAGNOSIS — E039 Hypothyroidism, unspecified: Secondary | ICD-10-CM | POA: Diagnosis present

## 2022-02-12 DIAGNOSIS — R55 Syncope and collapse: Secondary | ICD-10-CM | POA: Diagnosis present

## 2022-02-12 DIAGNOSIS — I5189 Other ill-defined heart diseases: Secondary | ICD-10-CM

## 2022-02-12 LAB — URINE DRUG SCREEN, QUALITATIVE (ARMC ONLY)
Amphetamines, Ur Screen: NOT DETECTED
Barbiturates, Ur Screen: NOT DETECTED
Benzodiazepine, Ur Scrn: NOT DETECTED
Cannabinoid 50 Ng, Ur ~~LOC~~: NOT DETECTED
Cocaine Metabolite,Ur ~~LOC~~: NOT DETECTED
MDMA (Ecstasy)Ur Screen: NOT DETECTED
Methadone Scn, Ur: NOT DETECTED
Opiate, Ur Screen: NOT DETECTED
Phencyclidine (PCP) Ur S: NOT DETECTED
Tricyclic, Ur Screen: POSITIVE — AB

## 2022-02-12 LAB — URINALYSIS, ROUTINE W REFLEX MICROSCOPIC
Bilirubin Urine: NEGATIVE
Glucose, UA: NEGATIVE mg/dL
Hgb urine dipstick: NEGATIVE
Ketones, ur: 5 mg/dL — AB
Leukocytes,Ua: NEGATIVE
Nitrite: NEGATIVE
Protein, ur: NEGATIVE mg/dL
Specific Gravity, Urine: 1.01 (ref 1.005–1.030)
pH: 8 (ref 5.0–8.0)

## 2022-02-12 LAB — CBC
HCT: 40.5 % (ref 36.0–46.0)
Hemoglobin: 13.1 g/dL (ref 12.0–15.0)
MCH: 29.4 pg (ref 26.0–34.0)
MCHC: 32.3 g/dL (ref 30.0–36.0)
MCV: 91 fL (ref 80.0–100.0)
Platelets: 254 10*3/uL (ref 150–400)
RBC: 4.45 MIL/uL (ref 3.87–5.11)
RDW: 12.5 % (ref 11.5–15.5)
WBC: 7.5 10*3/uL (ref 4.0–10.5)
nRBC: 0 % (ref 0.0–0.2)

## 2022-02-12 LAB — COMPREHENSIVE METABOLIC PANEL
ALT: 16 U/L (ref 0–44)
AST: 27 U/L (ref 15–41)
Albumin: 3.7 g/dL (ref 3.5–5.0)
Alkaline Phosphatase: 77 U/L (ref 38–126)
Anion gap: 11 (ref 5–15)
BUN: 12 mg/dL (ref 8–23)
CO2: 22 mmol/L (ref 22–32)
Calcium: 9.1 mg/dL (ref 8.9–10.3)
Chloride: 102 mmol/L (ref 98–111)
Creatinine, Ser: 0.94 mg/dL (ref 0.44–1.00)
GFR, Estimated: 59 mL/min — ABNORMAL LOW (ref >60)
Glucose, Bld: 138 mg/dL — ABNORMAL HIGH (ref 70–99)
Potassium: 3.8 mmol/L (ref 3.5–5.1)
Sodium: 135 mmol/L (ref 135–145)
Total Bilirubin: 0.9 mg/dL (ref 0.3–1.2)
Total Protein: 6.5 g/dL (ref 6.5–8.1)

## 2022-02-12 LAB — PROTIME-INR
INR: 1 (ref 0.8–1.2)
Prothrombin Time: 13.1 seconds (ref 11.4–15.2)

## 2022-02-12 LAB — T4, FREE: Free T4: 1.27 ng/dL — ABNORMAL HIGH (ref 0.61–1.12)

## 2022-02-12 LAB — HEMOGLOBIN A1C
Hgb A1c MFr Bld: 5.4 % (ref 4.8–5.6)
Mean Plasma Glucose: 108.28 mg/dL

## 2022-02-12 LAB — TROPONIN I (HIGH SENSITIVITY): Troponin I (High Sensitivity): 3 ng/L (ref <18)

## 2022-02-12 LAB — MAGNESIUM: Magnesium: 2.1 mg/dL (ref 1.7–2.4)

## 2022-02-12 LAB — TSH: TSH: 0.645 u[IU]/mL (ref 0.350–4.500)

## 2022-02-12 LAB — D-DIMER, QUANTITATIVE: D-Dimer, Quant: 0.82 ug/mL-FEU — ABNORMAL HIGH (ref 0.00–0.50)

## 2022-02-12 LAB — LIPASE, BLOOD: Lipase: 25 U/L (ref 11–51)

## 2022-02-12 MED ORDER — LACTATED RINGERS IV BOLUS
1000.0000 mL | Freq: Once | INTRAVENOUS | Status: AC
  Administered 2022-02-12: 1000 mL via INTRAVENOUS

## 2022-02-12 MED ORDER — METOPROLOL SUCCINATE ER 25 MG PO TB24
25.0000 mg | ORAL_TABLET | Freq: Every day | ORAL | Status: DC
  Administered 2022-02-13: 25 mg via ORAL
  Filled 2022-02-12: qty 1

## 2022-02-12 MED ORDER — ACETAMINOPHEN 160 MG/5ML PO SOLN: 650.0000 mg | ORAL | Status: DC | PRN

## 2022-02-12 MED ORDER — SODIUM CHLORIDE 0.9 % IV SOLN: INTRAVENOUS | Status: DC

## 2022-02-12 MED ORDER — CLOPIDOGREL BISULFATE 75 MG PO TABS
300.0000 mg | ORAL_TABLET | Freq: Once | ORAL | Status: AC
  Administered 2022-02-12: 300 mg via ORAL
  Filled 2022-02-12: qty 4

## 2022-02-12 MED ORDER — MECLIZINE HCL 25 MG PO TABS
25.0000 mg | ORAL_TABLET | Freq: Once | ORAL | Status: AC
  Administered 2022-02-12: 25 mg via ORAL
  Filled 2022-02-12: qty 1

## 2022-02-12 MED ORDER — FLUDROCORTISONE ACETATE 0.1 MG PO TABS
0.1000 mg | ORAL_TABLET | Freq: Every day | ORAL | Status: DC
  Administered 2022-02-13: 0.1 mg via ORAL
  Filled 2022-02-12: qty 1

## 2022-02-12 MED ORDER — ASPIRIN 325 MG PO TBEC
325.0000 mg | DELAYED_RELEASE_TABLET | Freq: Every day | ORAL | Status: DC
  Administered 2022-02-12 – 2022-02-13 (×2): 325 mg via ORAL
  Filled 2022-02-12 (×2): qty 1

## 2022-02-12 MED ORDER — PANTOPRAZOLE SODIUM 40 MG IV SOLR
40.0000 mg | Freq: Two times a day (BID) | INTRAVENOUS | Status: DC
  Administered 2022-02-12 – 2022-02-13 (×2): 40 mg via INTRAVENOUS
  Filled 2022-02-12 (×2): qty 10

## 2022-02-12 MED ORDER — HEPARIN SODIUM (PORCINE) 5000 UNIT/ML IJ SOLN
5000.0000 [IU] | Freq: Three times a day (TID) | INTRAMUSCULAR | Status: DC
Start: 2022-02-12 — End: 2022-02-13
  Administered 2022-02-12 – 2022-02-13 (×2): 5000 [IU] via SUBCUTANEOUS
  Filled 2022-02-12 (×2): qty 1

## 2022-02-12 MED ORDER — LEVOTHYROXINE SODIUM 50 MCG PO TABS: 50.0000 ug | ORAL_TABLET | Freq: Every day | ORAL | Status: DC

## 2022-02-12 MED ORDER — PREDNISONE 20 MG PO TABS
40.0000 mg | ORAL_TABLET | Freq: Once | ORAL | Status: AC
  Administered 2022-02-12: 40 mg via ORAL
  Filled 2022-02-12: qty 2

## 2022-02-12 MED ORDER — ACETAMINOPHEN 325 MG PO TABS: 650.0000 mg | ORAL_TABLET | ORAL | Status: DC | PRN

## 2022-02-12 MED ORDER — STROKE: EARLY STAGES OF RECOVERY BOOK: Freq: Once | Status: DC

## 2022-02-12 MED ORDER — CLOPIDOGREL BISULFATE 75 MG PO TABS
75.0000 mg | ORAL_TABLET | Freq: Every day | ORAL | Status: DC
  Administered 2022-02-13: 75 mg via ORAL
  Filled 2022-02-12: qty 1

## 2022-02-12 MED ORDER — IOHEXOL 350 MG/ML SOLN
75.0000 mL | Freq: Once | INTRAVENOUS | Status: AC | PRN
  Administered 2022-02-12: 75 mL via INTRAVENOUS

## 2022-02-12 MED ORDER — SIMVASTATIN 20 MG PO TABS: 20.0000 mg | ORAL_TABLET | Freq: Every day | ORAL | Status: DC

## 2022-02-12 MED ORDER — LORAZEPAM 2 MG/ML IJ SOLN
1.0000 mg | Freq: Once | INTRAMUSCULAR | Status: AC
  Administered 2022-02-12: 1 mg via INTRAVENOUS
  Filled 2022-02-12: qty 1

## 2022-02-12 MED ORDER — ONDANSETRON HCL 4 MG/2ML IJ SOLN
4.0000 mg | Freq: Once | INTRAMUSCULAR | Status: AC
  Administered 2022-02-12: 4 mg via INTRAVENOUS
  Filled 2022-02-12: qty 2

## 2022-02-12 MED ORDER — ACETAMINOPHEN 650 MG RE SUPP: 650.0000 mg | RECTAL | Status: DC | PRN

## 2022-02-12 NOTE — H&P (Signed)
History and Physical    Patient: Judy Jenkins UEA:[id number] DOB: [birth year] DOA: 02/12/2022 DOS: the patient was seen and examined on 02/13/2022 PCP: [facility]  Patient coming from: Home  Chief Complaint:  Chief Complaint  Patient presents with   Emesis   HPI: Judy Jenkins is a 87 y.o. female with medical history significant of hypothyroidism, orthostasis, le edema Coming for dizziness.Pt had finished her meal and got up and on way to sofa felt dizzy, reached the sofa and sat down And then laid down on her right side due to dizziness nausea and room spinning sensation.  Daughter at bedside. And ear exam shows middle ear effusion which is suspect is also causing her dizziness and hearing  Loss. Pt has h/o vertigo she sees ENT and takes meclizine intermittently.  Pt has not fallen or LOC. Exam today is nonfocal.    Review of Systems  Neurological:  Positive for dizziness.  All other systems reviewed and are negative.   Past Medical History:  Diagnosis Date   Cancer (HCC)    Tachycardia    Past Surgical History:  Procedure Laterality Date   ABDOMINAL HYSTERECTOMY     BREAST SURGERY     CHOLECYSTECTOMY     Social History:  reports that she has never smoked. She has never used smokeless tobacco. She reports that she does not drink alcohol and does not use drugs.  Allergies  Allergen Reactions   Avelox [Moxifloxacin Hcl In Nacl] Hives and Nausea And Vomiting   Ciprofloxacin Hives   Biaxin [Clarithromycin] Hives and Nausea And Vomiting   Atorvastatin Other (See Comments)    Myalgia    Codeine Other (See Comments)    Headaches   Sulfa Antibiotics Rash    Family History  Problem Relation Age of Onset   CAD Mother    CAD Father     Prior to Admission medications   Medication Sig Start Date End Date Taking? Authorizing Provider  amitriptyline (ELAVIL) 10 MG tablet Take 10 mg by mouth at bedtime.    [provider]  Biotin 1 MG  CAPS Take by mouth.    [provider]  Cholecalciferol 25 MCG (1000 UT) capsule Take by mouth.    [provider]  fludrocortisone (FLORINEF) 0.1 MG tablet fludrocortisone 0.1 mg tablet 10/11/21   [provider]  furosemide (LASIX) 20 MG tablet Take 20 mg by mouth daily. 11/16/21   [provider]  letrozole [facility]) 2.5 MG tablet  09/16/21   [provider]  levothyroxine (SYNTHROID, LEVOTHROID) 50 MCG tablet Take 50 mcg by mouth daily before breakfast.    [provider]  metoprolol succinate (TOPROL-XL) 25 MG 24 hr tablet Take 25 mg by mouth daily.    [provider]  omeprazole (PRILOSEC OTC) 20 MG tablet Prilosec 20 mg capsule,delayed release  Take 1 capsule every day by oral route.    [provider]  simvastatin (ZOCOR) 20 MG tablet Take 20 mg by mouth daily.    [provider]  Vitamin E 268 MG (400 UNIT) CAPS Take by mouth.    [provider]    Physical Exam: Vitals:   02/12/22 1900 02/12/22 2000 02/12/22 2043 02/12/22 2211  BP: (!) 124/98 (!) 141/81 (!) 162/76   Pulse: 77 79 80   Resp: 17 18 20    Temp: 98 F (36.7 C)  98.4 F (36.9 C)   TempSrc: Oral     SpO2: 99% 100% 98%  Weight:    59.6 kg  Height:    5\' 7"  (1.702 m)   Physical Exam Vitals and nursing note reviewed.  Constitutional:      General: She is awake. She is not in acute distress.    Appearance: Normal appearance. She is normal weight. She is not ill-appearing, toxic-appearing or diaphoretic.     Interventions: Nasal cannula in place.  HENT:     Head: Normocephalic and atraumatic.     Right Ear: Ear canal and external ear normal. Decreased hearing noted. A middle ear effusion is present.     Left Ear: Ear canal and external ear normal. Decreased hearing noted. A middle ear effusion is present.     Nose: Nose normal. No nasal deformity.     Mouth/Throat:     Lips: Pink.     Mouth: Mucous membranes are moist.      Tongue: No lesions. Tongue does not deviate from midline.     Pharynx: Oropharynx is clear.  Eyes:     General: Lids are normal.     Extraocular Movements: Extraocular movements intact.     Pupils: Pupils are equal, round, and reactive to light.  Neck:     Thyroid: No thyroid mass or thyroid tenderness.     Vascular: No carotid bruit.  Cardiovascular:     Rate and Rhythm: Normal rate and regular rhythm.     Pulses: Normal pulses.          Dorsalis pedis pulses are 2+ on the right side and 2+ on the left side.       Posterior tibial pulses are 2+ on the right side and 2+ on the left side.     Heart sounds: Normal heart sounds.  Pulmonary:     Effort: Pulmonary effort is normal.     Breath sounds: No wheezing.  Abdominal:     General: Bowel sounds are normal. There is no distension.     Palpations: Abdomen is soft. There is no mass.     Tenderness: There is no abdominal tenderness. There is no guarding.     Hernia: No hernia is present.  Musculoskeletal:     Cervical back: Full passive range of motion without pain.     Right lower leg: No edema.     Left lower leg: No edema.  Skin:    General: Skin is warm.  Neurological:     General: No focal deficit present.     Mental Status: She is alert and oriented to person, place, and time.     Cranial Nerves: Cranial nerves 2-12 are intact. No cranial nerve deficit, dysarthria or facial asymmetry.     Motor: Motor function is intact. No weakness, tremor, abnormal muscle tone or pronator drift.     Coordination: Coordination normal. Heel to Shin Test normal.     Deep Tendon Reflexes: Reflexes normal.     Reflex Scores:      Bicep reflexes are 2+ on the right side and 2+ on the left side.      Patellar reflexes are 2+ on the right side and 2+ on the left side. Psychiatric:        Attention and Perception: Attention normal.        Mood and Affect: Mood normal.        Speech: Speech normal.        Behavior: Behavior normal. Behavior is  cooperative.        Cognition and Memory: Cognition normal.  Data Reviewed: Results for orders placed or performed during the hospital encounter of 02/12/22 (from the past 24 hour(s))  Lipase, blood     Status: None   Collection Time: 02/12/22  1:59 PM  Result Value Ref Range   Lipase 25 11 - 51 U/L  Comprehensive metabolic panel     Status: Abnormal   Collection Time: 02/12/22  1:59 PM  Result Value Ref Range   Sodium 135 135 - 145 mmol/L   Potassium 3.8 3.5 - 5.1 mmol/L   Chloride 102 98 - 111 mmol/L   CO2 22 22 - 32 mmol/L   Glucose, Bld 138 (H) 70 - 99 mg/dL   BUN 12 8 - 23 mg/dL   Creatinine, Ser 9.56 0.44 - 1.00 mg/dL   Calcium 9.1 8.9 - 21.3 mg/dL   Total Protein 6.5 6.5 - 8.1 g/dL   Albumin 3.7 3.5 - 5.0 g/dL   AST 27 15 - 41 U/L   ALT 16 0 - 44 U/L   Alkaline Phosphatase 77 38 - 126 U/L   Total Bilirubin 0.9 0.3 - 1.2 mg/dL   GFR, Estimated 59 (L) >60 mL/min   Anion gap 11 5 - 15  CBC     Status: None   Collection Time: 02/12/22  1:59 PM  Result Value Ref Range   WBC 7.5 4.0 - 10.5 K/uL   RBC 4.45 3.87 - 5.11 MIL/uL   Hemoglobin 13.1 12.0 - 15.0 g/dL   HCT 08.6 57.8 - 46.9 %   MCV 91.0 80.0 - 100.0 fL   MCH 29.4 26.0 - 34.0 pg   MCHC 32.3 30.0 - 36.0 g/dL   RDW 62.9 52.8 - 41.3 %   Platelets 254 150 - 400 K/uL   nRBC 0.0 0.0 - 0.2 %  Troponin I (High Sensitivity)     Status: None   Collection Time: 02/12/22  1:59 PM  Result Value Ref Range   Troponin I (High Sensitivity) 3 <18 ng/L  Urinalysis, Routine w reflex microscopic     Status: Abnormal   Collection Time: 02/12/22  5:49 PM  Result Value Ref Range   Color, Urine STRAW (A) YELLOW   APPearance CLEAR (A) CLEAR   Specific Gravity, Urine 1.010 1.005 - 1.030   pH 8.0 5.0 - 8.0   Glucose, UA NEGATIVE NEGATIVE mg/dL   Hgb urine dipstick NEGATIVE NEGATIVE   Bilirubin Urine NEGATIVE NEGATIVE   Ketones, ur 5 (A) NEGATIVE mg/dL   Protein, ur NEGATIVE NEGATIVE mg/dL   Nitrite NEGATIVE NEGATIVE    Leukocytes,Ua NEGATIVE NEGATIVE     Assessment and Plan: * Dizziness Pt admitted for dizziness,pt admitted for TIA/CVA eval which is negative with negative MRI. Pt's did describe room spinning sensation, which I suspect is due to labyrinthitis. We will admit pt  And neurology following.  2 d echo pending. Fall precaution and aspiration precaution.  D/w pt about her lasix that she has been on for a while for leg swelling and takes metoprolol for tachycardia.  I do not see cardiology note, def needs 2D Echo to determine EF.  Currently we will hold lasix.  Vitals:   02/12/22 1346 02/12/22 1454 02/12/22 1500 02/12/22 1530  BP: (!) 151/80 (!) 141/85 (!) 164/71 (!) 150/84   02/12/22 1745 02/12/22 1830 02/12/22 1900 02/12/22 2000  BP: (!) 148/78 (!) 149/71 (!) 124/98 (!) 141/81   02/12/22 2043  BP: (!) 162/76   We will get orthostatic BP.    Acquired hypothyroidism Cont levothyroxine  and get ft4/tsh.    Positive D dimer With pt's history of LE edema and presyncope presentation we will evaluate for VTE.   Orthostatic hypotension We will obtain orthostatic vitals as pt has h/o orthostatic hypotension.   GERD (gastroesophageal reflux disease) Start iv PPI.   Anxiety Cont pt's elavil.    Breast cancer (HCC) Cont pt's femara.     Advance Care Planning:    Code Status: Full Code   Consults:  Neurology: Dr Luisa Hart.   Family Communication:  ELVIN, BELLVILLE (Spouse)  [phone] (Mobile)  Severity of Illness: The appropriate patient status for this patient is OBSERVATION. Observation status is judged to be reasonable and necessary in order to provide the required intensity of service to ensure the patient's safety. The patient's presenting symptoms, physical exam findings, and initial radiographic and laboratory data in the context of their medical condition is felt to place them at decreased risk for further clinical deterioration. Furthermore, it is anticipated that  the patient will be medically stable for discharge from the hospital within 2 midnights of admission.   Author: Gertha Calkin, MD 02/13/2022 1:47 AM  For on call review [email or web address].

## 2022-02-12 NOTE — Consult Note (Signed)
Neurology Consultation Reason for Consult: Vertigo Referring Physician: Michiel Sites  CC: Vertigo  History is obtained from: Patient, family  HPI: Judy Jenkins is a 87 y.o. female with a history of vertigo as well as repeated episodes of lightheadedness, that predominantly sound orthostatic in nature.  A month ago, however, She presented with an episode of lightheadedness and nearly passing out.  She was evaluated in the emergency department and was discharged after symptoms improved.  She states that she never got back to baseline, however, stating that she was feeling unsteady ever since that time.  Today, she was in her normal state of health until about noon at which point she had onset of sudden and severe vertigo.  She denies any diplopia, numbness, weakness, or other symptoms not related to the room spinning.  When she keeps her eyes closed that she is okay, but no matter how still she keeps her head she continues to have symptoms.  LKW: Noon tpa given?: no, concern for recent stroke a month ago   ROS: Unable to obtain due to altered mental status.   Past Medical History:  Diagnosis Date   Cancer (HCC)    Tachycardia      Family History  Problem Relation Age of Onset   CAD Mother    CAD Father      Social History:  reports that she has never smoked. She has never used smokeless tobacco. She reports that she does not drink alcohol and does not use drugs.   Exam: Current vital signs: BP (!) 164/71   Pulse 80   Temp 97.7 F (36.5 C) (Oral)   Resp 19   Ht 5\' 7"  (1.702 m)   Wt 59 kg   SpO2 100%   BMI 20.36 kg/m  Vital signs in last 24 hours: Temp:  [97.7 F (36.5 C)] 97.7 F (36.5 C) (06/25 1346) Pulse Rate:  [78-81] 80 (06/25 1500) Resp:  [18-21] 19 (06/25 1500) BP: (141-164)/(71-85) 164/71 (06/25 1500) SpO2:  [97 %-100 %] 100 % (06/25 1500) Weight:  [59 kg] 59 kg (06/25 1344)   Physical Exam  Constitutional: Appears well-developed and  well-nourished.  Psych: Affect appropriate to situation Eyes: No scleral injection HENT: No OP obstruction MSK: no joint deformities.  Cardiovascular: Normal rate and regular rhythm.  Respiratory: Effort normal, non-labored breathing GI: Soft.  No distension. There is no tenderness.  Skin: WDI  Neuro: Mental Status: Patient is awake, alert, oriented to person, place, month, year, and situation. Patient is able to give a clear and coherent history. No signs of aphasia or neglect Cranial Nerves: II: Visual Fields are full. Pupils are equal, round, and reactive to light.   III,IV, VI: EOMI without ptosis or diploplia.  She has significant nystagmus which is left beating with a rotary component in all directions of gaze.  She resists head impulse maneuver repeatedly, when I am able to perform it towards the right, I suspect there is a mild corrective saccade, but given the degree of her nystagmus this is uncertain. V: Facial sensation is symmetric to temperature VII: Facial movement is symmetric.  VIII: hearing is intact to voice X: Uvula elevates symmetrically XI: Shoulder shrug is symmetric. XII: tongue is midline without atrophy or fasciculations.  Motor: Tone is normal. Bulk is normal. 5/5 strength was present in all four extremities.  Sensory: Sensation is symmetric to light touch and temperature in the arms and legs. Cerebellar: FNF and HKS are intact bilaterally  I have reviewed labs in epic and the results pertinent to this consultation are: Creatinine 0.94  I have reviewed the images obtained: CT head-negative, though there is significant basilar calcification  Impression: 87 year old female with acute vertigo.  Given the acuity of the onset, I am concerned that this may represent cerebellar infarct, though not definite by exam.  Alternatively, labyrinthitis would be a consideration though the onset was rather acute.  Given these findings, I would favor pursuing a  stroke work-up and starting her on antiplatelet therapy pending further evaluation.  Without clear evidence of appendicular ataxia, her symptom really is isolated vertigo.  This does tend to improve with time and therapy.  Also with her symptoms that started a month ago and were persistent, I would have high concern that if what we are seeing today represent stroke, that represented a stroke as well in which case it would be an exclusion criteria for TNK today.  Based on this, I would not favor doing any type of thrombolytic therapy and I discussed this with the family.  Recommendations: - HgbA1c, fasting lipid panel - MRI of the brain without contrast - Frequent neuro checks - Echocardiogram - CTA head and neck - Prophylactic therapy-Antiplatelet med: Aspirin - dose 81mg  and plavix 75mg  daily  after 300mg  load  - Risk factor modification - Telemetry monitoring - PT consult, OT consult, Speech consult -If MRI is negative, may consider steroids for presumed labrinthitis   [name], MD [facility] [phone]  If 7pm- 7am, please page neurology on call as listed in AMION.

## 2022-02-12 NOTE — ED Provider Notes (Signed)
[facility] Provider Note    Event Date/Time   First MD Initiated Contact with Patient 02/12/22 1455     (approximate)   History   Emesis   HPI  Judy Jenkins is a 87 y.o. female  with a history of breast cancer status postlumpectomy, fibromyalgia, hypertension, hyperlipidemia and hypothyroidism who presents for evaluation of sudden onset of dizziness.  She states it started shortly after she had lunch today around noon.  She states it is similar to prior vertigo episode she has had in the past although much more severe and not only with head movement but at rest as well.  Head movement does make it worse.  She attempted to take some meclizine and Xanax to help with her symptoms although vomited these up.  She states she had some mild congestion last couple days.  She states she felt otherwise in her normal state of health this morning prior to onset of these acute symptoms although it sounds like she has had some baseline unsteadiness of the last month since a similar episode about a month ago.  No recent chest pain, cough, fevers, shortness of breath, abdominal pain, diarrhea, urinary symptoms or any other extremity focal weakness numbness or tingling.  No recent falls.  She is not on blood thinners.      Physical Exam  Triage Vital Signs: ED Triage Vitals  Enc Vitals Group     BP 02/12/22 1346 (!) 151/80     Pulse Rate 02/12/22 1346 81     Resp 02/12/22 1346 18     Temp 02/12/22 1346 97.7 F (36.5 C)     Temp Source 02/12/22 1346 Oral     SpO2 02/12/22 1346 97 %     Weight 02/12/22 1344 130 lb (59 kg)     Height 02/12/22 1344 5\' 7"  (1.702 m)     Head Circumference --      Peak Flow --      Pain Score 02/12/22 1344 0     Pain Loc --      Pain Edu? --      Excl. in GC? --     Most recent vital signs: Vitals:   02/12/22 1745 02/12/22 1830  BP: (!) 148/78 (!) 149/71  Pulse: 85 76  Resp: 20 17  Temp:    SpO2: 100% 99%     General: Awake, appears to be fairly uncomfortable intermittently dry heaving with towel over the face. CV:  Good peripheral perfusion.  2+ radial pulses. Resp:  Normal effort.  Clear bilaterally. Abd:  No distention.  Soft. Other:  Patient is symmetric upper extremity and lower extremity strength.  Sensation is intact to light touch in all extremities.  There is no pronator drift.  She is unable to tolerate finger-nose testing my exam.  She does not appear to have left-sided horizontal nystagmus.  Pupils are otherwise unremarkable.  No other obvious cranial nerve deficits.  She is too nauseous for trial of ambulation.   ED Results / Procedures / Treatments  Labs (all labs ordered are listed, but only abnormal results are displayed) Labs Reviewed  COMPREHENSIVE METABOLIC PANEL - Abnormal; Notable for the following components:      Result Value   Glucose, Bld 138 (*)    GFR, Estimated 59 (*)    All other components within normal limits  URINALYSIS, ROUTINE W REFLEX MICROSCOPIC - Abnormal; Notable for the following components:   Color, Urine STRAW (*)  APPearance CLEAR (*)    Ketones, ur 5 (*)    All other components within normal limits  LIPASE, BLOOD  CBC  HEMOGLOBIN A1C  PROTIME-INR  APTT  TROPONIN I (HIGH SENSITIVITY)     EKG  ECG is markable sinus rhythm with a ventricular rate of 82, PVCs, unremarkable intervals with Q waves in septal leads without other clear evidence of acute ischemia or significant arrhythmia.   RADIOLOGY   CTA head and neck, interpretation without evidence of hemorrhage, clear acute ischemia, or large vessel occlusion.  I reviewed radiology interpretation and agree with the findings of chronic microvascular ischemic changes without any acute changes noted as well as mild atherosclerotic calcifications and mild fibromuscular dysplasia in the left internal carotid without evidence of stenosis or dissection.  No other acute process noted by  radiology.  MR brain on my interpretation without evidence of acute ischemia.  I reviewed radiology interpretation and agree with their findings of same in addition to mild chronic microvascular ischemic changes and a right mastoid effusion.  PROCEDURES:  Critical Care performed: No  .1-3 Lead EKG Interpretation  Performed by: Gilles Chiquito, MD Authorized by: Gilles Chiquito, MD     Interpretation: normal     ECG rate assessment: normal     Rhythm: sinus rhythm     Ectopy: none     Conduction: normal     The patient is on the cardiac monitor to evaluate for evidence of arrhythmia and/or significant heart rate changes.   MEDICATIONS ORDERED IN ED: Medications  aspirin EC tablet 325 mg (325 mg Oral Given 02/12/22 1744)  clopidogrel (PLAVIX) tablet 75 mg (has no administration in time range)  LORazepam (ATIVAN) injection 1 mg (1 mg Intravenous Given 02/12/22 1536)  lactated ringers bolus 1,000 mL (0 mLs Intravenous Stopped 02/12/22 1802)  ondansetron (ZOFRAN) injection 4 mg (4 mg Intravenous Given 02/12/22 1535)  iohexol (OMNIPAQUE) 350 MG/ML injection 75 mL (75 mLs Intravenous Contrast Given 02/12/22 1608)  clopidogrel (PLAVIX) tablet 300 mg (300 mg Oral Given 02/12/22 1744)  meclizine (ANTIVERT) tablet 25 mg (25 mg Oral Given 02/12/22 1824)  predniSONE (DELTASONE) tablet 40 mg (40 mg Oral Given 02/12/22 1824)     IMPRESSION / MDM / ASSESSMENT AND PLAN / ED COURSE  I reviewed the triage vital signs and the nursing notes. Patient's presentation is most consistent with acute presentation with potential threat to life or bodily function.                               Differential diagnosis includes, but is not limited to posterior stroke, peripheral vertigo, atypical ACS, metabolic derangements versus acute infectious process.  Given concern for possible CVA I did consult with neurology immediately after I evaluate the patient who came and saw the patient immediately in the  emergency room.  No indication for thrombolytics at this time although Dr. Luisa Hart evaluated the patient did agree with further evaluation including CTA head and neck and MR brain.  In the meantime we will give some IV fluids IV Ativan and some Zofran.  ECG is markable sinus rhythm with a ventricular rate of 82, PVCs, unremarkable intervals with Q waves in septal leads without other clear evidence of acute ischemia or significant arrhythmia.  CMP without any significant electrolyte or metabolic derangements.  Lipase WNL and not suggestive of pancreatitis.  CBC without leukocytosis or acute anemia.  CTA head and neck, interpretation  without evidence of hemorrhage, clear acute ischemia, or large vessel occlusion.  I reviewed radiology interpretation and agree with the findings of chronic microvascular ischemic changes without any acute changes noted as well as mild atherosclerotic calcifications and mild fibromuscular dysplasia in the left internal carotid without evidence of stenosis or dissection.  No other acute process noted by radiology.  MR brain on my interpretation without evidence of acute ischemia.  I reviewed radiology interpretation and agree with their findings of same in addition to mild chronic microvascular ischemic changes and a right mastoid effusion.  UA does not appear infected.  Patient is still extremely weak although states her nausea and dizziness little better on my reassessment.  She was unable to ambulate independently.  We will start some prednisone per neurology recommendation for possible laryngitis.  I will admit to medicine service for further evaluation and management.       FINAL CLINICAL IMPRESSION(S) / ED DIAGNOSES   Final diagnoses:  Dizziness  Weakness  Nausea and vomiting, unspecified vomiting type     Rx / DC Orders   ED Discharge Orders     None        Note:  This document was prepared using Dragon voice recognition software and may include  unintentional dictation errors.   Gilles Chiquito, MD 02/12/22 [phone]

## 2022-02-13 ENCOUNTER — Observation Stay: Payer: Medicare HMO

## 2022-02-13 ENCOUNTER — Other Ambulatory Visit (HOSPITAL_COMMUNITY): Payer: Self-pay

## 2022-02-13 ENCOUNTER — Observation Stay
Admit: 2022-02-13 | Discharge: 2022-02-13 | Disposition: A | Discharge status: Home / Self Care | Payer: Medicare HMO | Attending: Internal Medicine | Admitting: Internal Medicine

## 2022-02-13 DIAGNOSIS — I5189 Other ill-defined heart diseases: Secondary | ICD-10-CM

## 2022-02-13 DIAGNOSIS — R42 Dizziness and giddiness: Secondary | ICD-10-CM | POA: Diagnosis not present

## 2022-02-13 DIAGNOSIS — R7989 Other specified abnormal findings of blood chemistry: Secondary | ICD-10-CM | POA: Diagnosis present

## 2022-02-13 LAB — LIPID PANEL
Cholesterol: 174 mg/dL (ref 0–200)
HDL: 76 mg/dL (ref >40)
LDL Cholesterol: 90 mg/dL (ref 0–99)
Total CHOL/HDL Ratio: 2.3 RATIO
Triglycerides: 38 mg/dL (ref <150)
VLDL: 8 mg/dL (ref 0–40)

## 2022-02-13 LAB — APTT: aPTT: 28 seconds (ref 24–36)

## 2022-02-13 LAB — ECHOCARDIOGRAM COMPLETE
AR max vel: 2.94 cm2
AV Area VTI: 3.44 cm2
AV Area mean vel: 2.47 cm2
AV Mean grad: 1 mmHg
AV Peak grad: 2.6 mmHg
Ao pk vel: 0.8 m/s
Area-P 1/2: 3.93 cm2
Height: 67 in
S' Lateral: 2.5 cm
Weight: 2102.31 oz

## 2022-02-13 MED ORDER — IOHEXOL 350 MG/ML SOLN
75.0000 mL | Freq: Once | INTRAVENOUS | Status: AC | PRN
  Administered 2022-02-13: 75 mL via INTRAVENOUS

## 2022-02-13 MED ORDER — AMITRIPTYLINE HCL 10 MG PO TABS
10.0000 mg | ORAL_TABLET | Freq: Every day | ORAL | Status: DC
  Filled 2022-02-13: qty 1

## 2022-02-13 MED ORDER — ASPIRIN 81 MG PO TBEC
81.0000 mg | DELAYED_RELEASE_TABLET | Freq: Every day | ORAL | 0 refills | Status: AC
Start: 2022-02-14 — End: ?

## 2022-02-13 MED ORDER — PREDNISONE 20 MG PO TABS
20.0000 mg | ORAL_TABLET | Freq: Every day | ORAL | Status: DC
  Administered 2022-02-13: 20 mg via ORAL
  Filled 2022-02-13: qty 1

## 2022-02-13 MED ORDER — ROSUVASTATIN CALCIUM 10 MG PO TABS: 10.0000 mg | ORAL_TABLET | Freq: Every day | ORAL | 0 refills | Status: AC

## 2022-02-13 MED ORDER — PREDNISONE 10 MG PO TABS
ORAL_TABLET | ORAL | 0 refills | Status: AC
Start: 2022-02-14 — End: 2022-02-17

## 2022-02-13 MED ORDER — ROSUVASTATIN CALCIUM 10 MG PO TABS: 20.0000 mg | ORAL_TABLET | Freq: Every day | ORAL | Status: DC

## 2022-02-13 MED ORDER — CLOPIDOGREL BISULFATE 75 MG PO TABS
75.0000 mg | ORAL_TABLET | Freq: Every day | ORAL | 0 refills | Status: AC
Start: 2022-02-14 — End: 2022-03-07

## 2022-02-13 MED ORDER — LETROZOLE 2.5 MG PO TABS
2.5000 mg | ORAL_TABLET | Freq: Every day | ORAL | Status: DC
  Administered 2022-02-13: 2.5 mg via ORAL
  Filled 2022-02-13: qty 1

## 2022-02-13 MED ORDER — ATORVASTATIN CALCIUM 20 MG PO TABS: 40.0000 mg | ORAL_TABLET | Freq: Every day | ORAL | Status: DC

## 2022-02-13 NOTE — Assessment & Plan Note (Signed)
Cont pt's femara.

## 2022-02-13 NOTE — Assessment & Plan Note (Signed)
Cont pt's elavil.

## 2022-02-13 NOTE — Assessment & Plan Note (Addendum)
Pt admitted for dizziness --> TIA/CVA eval as below "Spinning" sensation c/w vertigo, labyrinthitis   negative MRI brain  No stenosis on CTA H/N  Negative CTA chest for PE  Echo: LVEF 55-60, mod LVH, Grade I diastolic dysfunction  Additional Neurology recs:  DAPT for 21 day course then continue home ASA 81 lifelong  MRI negative can consider steroids for presumed labyrinthitis --> received prednisone 40 mg po x1 yesterday, ordered 20 mg po x1 today consider outpatient taper   SLP: passed swallow    PT/OT: outpatient PT / vestibular rehab   Risk stratification:   A1C WNL  Lipids LDL 90 --> increased statin on discharge   BP control long term  outpatient vestibular PT may also be helpful  Orthostatic VS  Holding home lasix --> restart lower dose on discharge   Per neuro: No further neurological workup needed inpatient, patient may follow-up with PCP

## 2022-02-13 NOTE — Evaluation (Signed)
Physical Therapy Evaluation Patient Details Name: Judy Jenkins MRN: [id number] DOB: 09/19/1934 Today's Date: 02/13/2022  History of Present Illness  87 y.o. female with medical history significant of vertigo, hypothyroidism, orthostasis, LE edema and recent treatment of breast cancer. She presents to ED with dizziness.   Clinical Impression  Pt received supine in bed; husband in room and agreeable to therapy. Pt is independent/Mod I at baseline; active in her garden and does drive. Only recently she began using [facility] for ambulation due to fall 1 month ago caused by brief LOC and resulted in right rib fx. Pt denies feeling of dizziness this date. She states when dizziness is present, it feels as though the room is spinning; sometimes states minor changes in head position can cause this sensation. Today, pt performed bed mobility, STS and 234ft ambulation. She did not require physical assistance; PT provided CGA for safety due to subjective statement that pt feels weak. PT encouraged mobilization with family or nursing staff while in hospital. She would benefit from OPPT to address generalized weakness following recent cancer treatments and fall as well as possible vestibular evaluation. No further PT needs in acute setting.      Recommendations for follow up therapy are one component of a multi-disciplinary discharge planning process, led by the attending physician.  Recommendations may be updated based on patient status, additional functional criteria and insurance authorization.  Follow Up Recommendations Outpatient PT (with vestibular specialist)      Assistance Recommended at Discharge PRN  Patient can return home with the following  A little help with walking and/or transfers;A little help with bathing/dressing/bathroom;Help with stairs or ramp for entrance;Assist for transportation;Assistance with cooking/housework    Equipment Recommendations None recommended by PT  Recommendations  for Other Services       Functional Status Assessment Patient has had a recent decline in their functional status and demonstrates the ability to make significant improvements in function in a reasonable and predictable amount of time.     Precautions / Restrictions Precautions Precautions: Fall Restrictions Weight Bearing Restrictions: No      Mobility  Bed Mobility Overal bed mobility: Modified Independent                  Transfers Overall transfer level: Needs assistance Equipment used: None Transfers: Sit to/from Stand Sit to Stand: Min guard           General transfer comment: CGA for safety; no physical assist    Ambulation/Gait Ambulation/Gait assistance: Min guard Gait Distance (Feet): 200 Feet Assistive device: IV Pole Gait Pattern/deviations: Step-through pattern, Decreased stride length, Trunk flexed Gait velocity: decreased velocity     General Gait Details: Light steadying via IV pole. PT remained CGA for safety. No LOB.  Stairs            Wheelchair Mobility    Modified Rankin (Stroke Patients Only)       Balance Overall balance assessment: Mild deficits observed, not formally tested                                           Pertinent Vitals/Pain Pain Assessment Pain Assessment: No/denies pain    Home Living Family/patient expects to be discharged to:: Private residence Living Arrangements: Spouse/significant other Available Help at Discharge: Family;Available 24 hours/day Type of Home: House Home Access: Ramped entrance       Home  Layout: One level Home Equipment: Grab bars - tub/shower;Rolling Walker (2 wheels);BSC/3in1;Cane - single point Additional Comments: Has been using [facility] for the past month due to dizziness and fall.    Prior Function Prior Level of Function : Independent/Modified Independent;History of Falls (last six months);Driving             Mobility Comments: Pt is active at  baseline; enjoys gardening. She drives and is independent/Mod I at baseline. Only recently has begun to use [facility] after falling 1 month ago resulting in right rib fx. ADLs Comments: Independent; cooks/cleans/grocery shops     Hand Dominance        Extremity/Trunk Assessment   Upper Extremity Assessment Upper Extremity Assessment: Generalized weakness;RUE deficits/detail RUE Deficits / Details: breast cancer on right side    Lower Extremity Assessment Lower Extremity Assessment: Generalized weakness       Communication   Communication: No difficulties  Cognition Arousal/Alertness: Awake/alert Behavior During Therapy: WFL for tasks assessed/performed Overall Cognitive Status: Within Functional Limits for tasks assessed                                          General Comments      Exercises     Assessment/Plan    PT Assessment All further PT needs can be met in the next venue of care  PT Problem List Decreased strength;Decreased activity tolerance;Decreased mobility       PT Treatment Interventions      PT Goals (Current goals can be found in the Care Plan section)  Acute Rehab PT Goals Patient Stated Goal: to get stronger; no more falls PT Goal Formulation: With patient Time For Goal Achievement: 02/27/22 Potential to Achieve Goals: Good    Frequency       Co-evaluation               AM-PAC PT "6 Clicks" Mobility  Outcome Measure Help needed turning from your back to your side while in a flat bed without using bedrails?: None Help needed moving from lying on your back to sitting on the side of a flat bed without using bedrails?: None Help needed moving to and from a bed to a chair (including a wheelchair)?: A Little Help needed standing up from a chair using your arms (e.g., wheelchair or bedside chair)?: A Little Help needed to walk in hospital room?: A Little Help needed climbing 3-5 steps with a railing? : A Little 6 Click Score:  20    End of Session Equipment Utilized During Treatment: Gait belt Activity Tolerance: Patient tolerated treatment well Patient left: in bed;with family/visitor present Nurse Communication: Mobility status PT Visit Diagnosis: History of falling (Z91.81);Muscle weakness (generalized) (M62.81);Other (comment) (possible vestibular treatment)    Time: 1914-7829 PT Time Calculation (min) (ACUTE ONLY): 23 min   Charges:   PT Evaluation $PT Eval Moderate Complexity: 1 Mod PT Treatments $Therapeutic Activity: 8-22 mins        Basilia Jumbo PT, DPT

## 2022-02-13 NOTE — Progress Notes (Signed)
*  PRELIMINARY RESULTS* Echocardiogram 2D Echocardiogram has been performed.  Cristela Blue 02/13/2022, 10:08 AM

## 2022-02-13 NOTE — Assessment & Plan Note (Signed)
Cont levothyroxine and get ft4/tsh.

## 2022-03-12 IMAGING — DX DG ABDOMEN 1V
1 series · 1 of 1 positions shown · non-contrast
Comparison: CT abdomen 02/26/2018

CLINICAL DATA: Constipation

EXAM:
ABDOMEN - 1 VIEW

[abdomen supine]
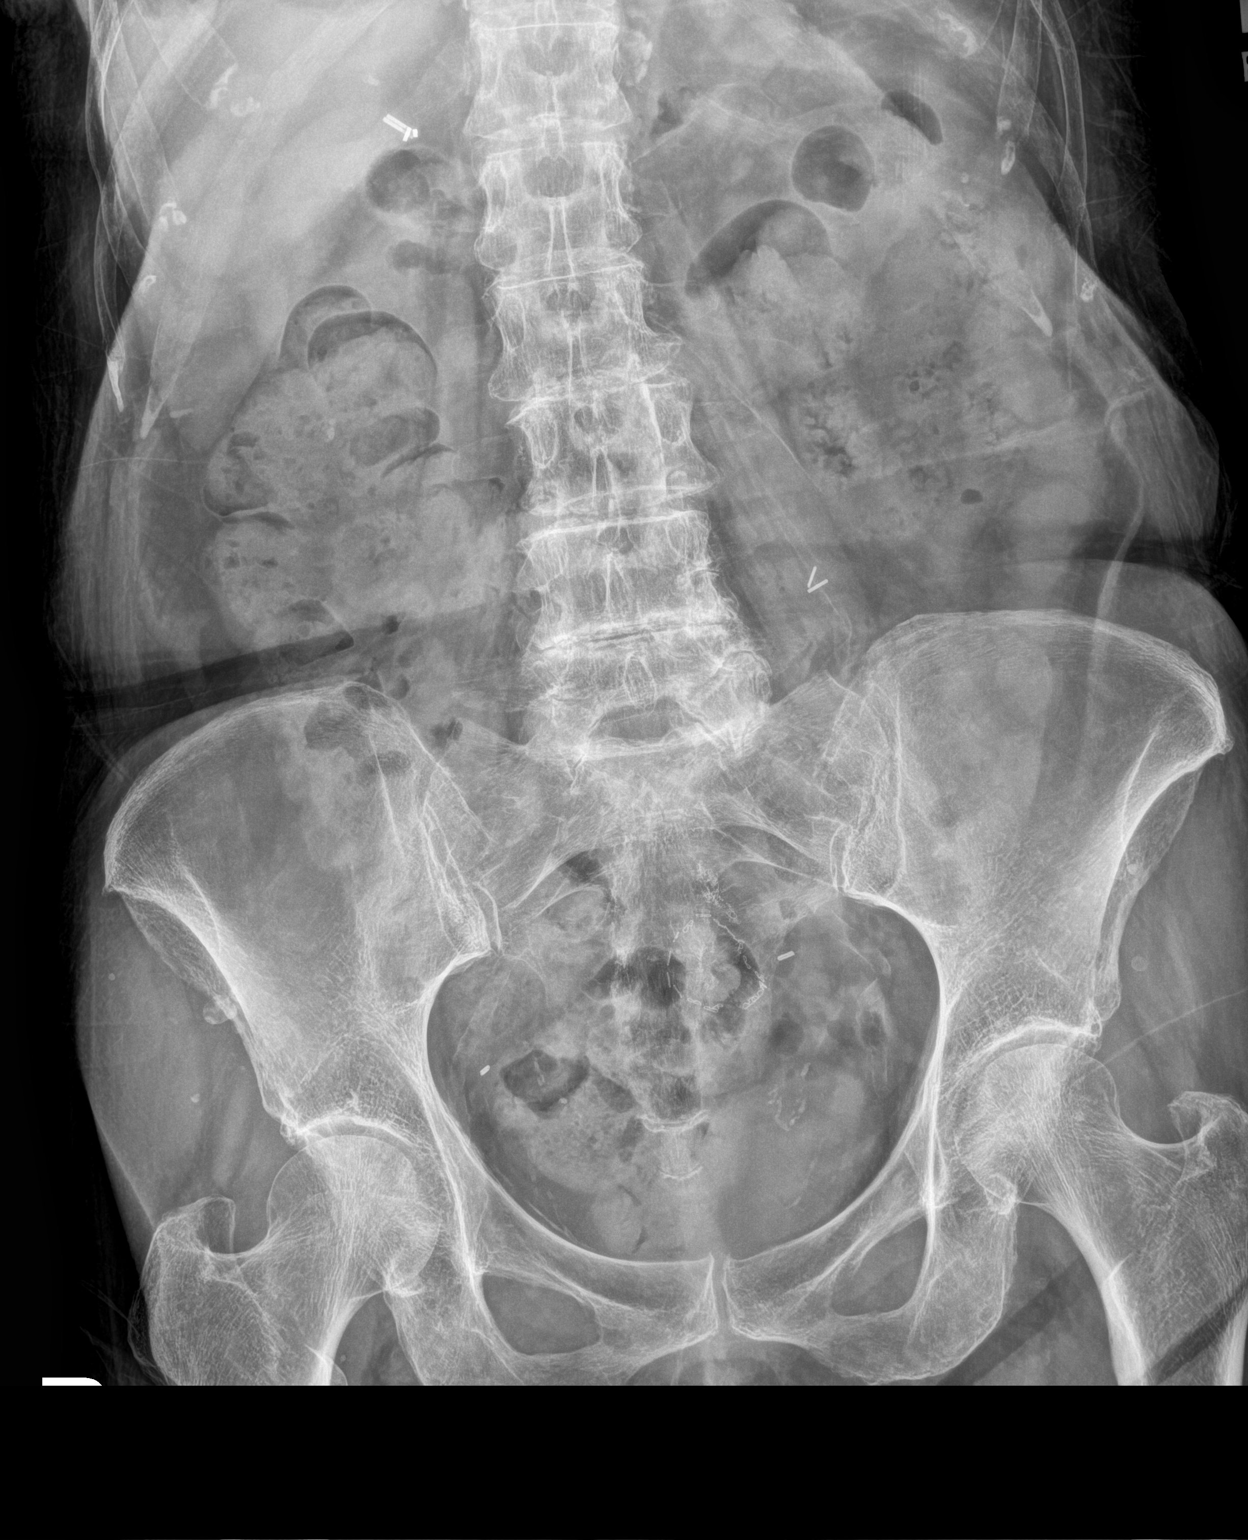

[1 of 1 positions shown; findings below may reference images not displayed]

FINDINGS: Mild prominence of stool in the ascending and transverse colon but
not distally in the colon.

Various clips project over the abdomen from prior surgeries. This
includes an anastomotic staple line in the vicinity of the sigmoid
colon.

Atherosclerotic vascular calcifications noted.

Lower lumbar spondylosis and degenerative disc disease
IMPRESSION: 1. Mild prominence of colonic stool in the proximal and transverse
colon but not distally. Borderline appearance for constipation.
2. Atherosclerosis.
3. Lower lumbar spondylosis and degenerative disc disease.

## 2022-06-19 ENCOUNTER — Encounter (INDEPENDENT_AMBULATORY_CARE_PROVIDER_SITE_OTHER): Payer: Self-pay

## 2022-07-28 ENCOUNTER — Ambulatory Visit (INDEPENDENT_AMBULATORY_CARE_PROVIDER_SITE_OTHER): Payer: Medicare HMO | Admitting: Vascular Surgery

## 2022-09-26 ENCOUNTER — Other Ambulatory Visit: Payer: Self-pay | Admitting: Gastroenterology

## 2022-09-26 DIAGNOSIS — K219 Gastro-esophageal reflux disease without esophagitis: Secondary | ICD-10-CM

## 2022-10-03 ENCOUNTER — Ambulatory Visit
Admission: RE | Admit: 2022-10-03 | Discharge: 2022-10-03 | Disposition: A | Discharge status: Home / Self Care | Payer: Medicare HMO | Source: Ambulatory Visit | Attending: Gastroenterology | Admitting: Gastroenterology

## 2022-10-03 DIAGNOSIS — K219 Gastro-esophageal reflux disease without esophagitis: Secondary | ICD-10-CM | POA: Insufficient documentation

## 2022-10-06 ENCOUNTER — Ambulatory Visit
Admission: EM | Admit: 2022-10-06 | Discharge: 2022-10-06 | Disposition: A | Discharge status: Home / Self Care | Payer: Medicare HMO | Attending: Physician Assistant | Admitting: Physician Assistant

## 2022-10-06 ENCOUNTER — Encounter: Payer: Self-pay | Admitting: Emergency Medicine

## 2022-10-06 DIAGNOSIS — R519 Headache, unspecified: Secondary | ICD-10-CM | POA: Insufficient documentation

## 2022-10-06 DIAGNOSIS — R051 Acute cough: Secondary | ICD-10-CM | POA: Diagnosis present

## 2022-10-06 DIAGNOSIS — U071 COVID-19: Secondary | ICD-10-CM | POA: Insufficient documentation

## 2022-10-06 LAB — RESP PANEL BY RT-PCR (RSV, FLU A&B, COVID)  RVPGX2
Influenza A by PCR: NEGATIVE
Influenza B by PCR: NEGATIVE
Resp Syncytial Virus by PCR: NEGATIVE
SARS Coronavirus 2 by RT PCR: POSITIVE — AB

## 2022-10-06 MED ORDER — BENZONATATE 200 MG PO CAPS: 200.0000 mg | ORAL_CAPSULE | Freq: Three times a day (TID) | ORAL | 0 refills | Status: DC | PRN

## 2022-10-06 MED ORDER — PAXLOVID (150/100) 10 X 150 MG & 10 X 100MG PO TBPK: ORAL_TABLET | ORAL | 0 refills | Status: DC

## 2022-10-06 MED ORDER — BENZONATATE 200 MG PO CAPS
200.0000 mg | ORAL_CAPSULE | Freq: Three times a day (TID) | ORAL | 0 refills | Status: DC | PRN
Start: 2022-10-06 — End: 2022-10-06

## 2022-10-06 NOTE — ED Triage Notes (Signed)
Patient c/o sinus congestion and pressure that started 3 days ago.  Patient reports green sinus drainage.  Patient reports headache, cough and chest congestion that started yesterday.  Patient states that she took one dose of Levaquin this morning that she had left over from another visit. Patient denies fevers.

## 2022-10-06 NOTE — ED Provider Notes (Signed)
[facility]    CSN: [id number] Arrival date & time: 10/06/22  [id number]      History   Chief Complaint Chief Complaint  Patient presents with   Sinus Problem   Headache    HPI Judy Jenkins is a 88 y.o. female presenting for approximately 3-day history of fatigue, sinus pressure, nasal congestion with yellowish-green drainage, chest congestion and headaches.  She denies fever.  Reports temperatures have been about 99 degrees.  Denies weakness, sore throat, chest pain, shortness of breath, vomiting or diarrhea.  No sick contacts.  Patient reports that she has taken Levaquin for suspected sinusitis.  States she had a flareup of her from an old prescription.  She has also been performing sinus rinses.  Not taking any cough medicines.  Denies any other complaints or concerns.  HPI  Past Medical History:  Diagnosis Date   Cancer ([place])    Tachycardia     Patient Active Problem List   Diagnosis Date Noted   Positive D dimer 02/13/2022   Grade I diastolic dysfunction, LVH 02/13/2022   Dizziness 02/12/2022   Pain in limb 12/22/2021   Swelling of limb 12/22/2021   Breast cancer ([place]) 12/22/2021   Orthostatic hypotension 02/03/2019   Acquired hypothyroidism 04/26/2011   Anxiety 04/26/2011   GERD (gastroesophageal reflux disease) w/ large hiatal hernia  04/26/2011    Past Surgical History:  Procedure Laterality Date   ABDOMINAL HYSTERECTOMY     BREAST SURGERY     CHOLECYSTECTOMY      OB History   No obstetric history on file.      Home Medications    Prior to Admission medications   Medication Sig Start Date End Date Taking? Authorizing Provider  benzonatate (TESSALON) 200 MG capsule Take 1 capsule (200 mg total) by mouth 3 (three) times daily as needed for cough. 10/06/22  Yes Danton Clap, PA-C  nirmatrelvir & ritonavir (PAXLOVID, 150/100,) 10 x 150 MG & 10 x 100MG TBPK GFR 55. Take Paxlovid renal dose x 5 days 10/06/22  Yes Laurene Footman B, PA-C   amitriptyline (ELAVIL) 10 MG tablet Take 10 mg by mouth at bedtime.    [provider]  aspirin EC 81 MG tablet Take 1 tablet (81 mg total) by mouth daily. 02/14/22   Emeterio Reeve, DO  Biotin 1 MG CAPS Take by mouth. Patient not taking: Reported on 02/13/2022    [provider]  Cholecalciferol 25 MCG (1000 UT) capsule Take 1,000 Units by mouth daily.    [provider]  fludrocortisone (FLORINEF) 0.1 MG tablet Take 0.05 mg by mouth daily. 10/11/21   [provider]  furosemide (LASIX) 20 MG tablet Take 20 mg by mouth daily. Patient not taking: Reported on 02/13/2022 11/16/21   [provider]  letrozole (FEMARA) 2.5 MG tablet Take 2.5 mg by mouth daily. 09/16/21   [provider]  levothyroxine (SYNTHROID) 75 MCG tablet Take 75 mcg by mouth daily. 01/30/22   [provider]  metoprolol succinate (TOPROL-XL) 25 MG 24 hr tablet Take 25 mg by mouth daily.    [provider]  omeprazole (PRILOSEC OTC) 20 MG tablet Prilosec 20 mg capsule,delayed release  Take 1 capsule every day by oral route.    [provider]  rosuvastatin (CRESTOR) 10 MG tablet Take 1 tablet (10 mg total) by mouth daily. 02/13/22   Emeterio Reeve, DO  Vitamin E 268 MG (400 UNIT) CAPS Take 400 Units by mouth daily.  [provider]    Family History Family History  Problem Relation Age of Onset   CAD Mother    CAD Father     Social History Social History   Tobacco Use   Smoking status: Never   Smokeless tobacco: Never  Vaping Use   Vaping Use: Never used  Substance Use Topics   Alcohol use: No   Drug use: No     Allergies   Avelox [moxifloxacin hcl in nacl], Ciprofloxacin, Biaxin [clarithromycin], Atorvastatin, Codeine, and Sulfa antibiotics   Review of Systems Review of Systems  Constitutional:  Positive for fatigue. Negative for chills, diaphoresis and fever.  HENT:  Positive for congestion, rhinorrhea and  sinus pressure. Negative for ear pain, sinus pain and sore throat.   Respiratory:  Positive for cough. Negative for shortness of breath.   Gastrointestinal:  Negative for abdominal pain, nausea and vomiting.  Musculoskeletal:  Negative for myalgias.  Skin:  Negative for rash.  Neurological:  Positive for headaches. Negative for weakness.  Hematological:  Negative for adenopathy.     Physical Exam Triage Vital Signs ED Triage Vitals  Enc Vitals Group     BP      Pulse      Resp      Temp      Temp src      SpO2      Weight      Height      Head Circumference      Peak Flow      Pain Score      Pain Loc      Pain Edu?      Excl. in [place]?    No data found.  Updated Vital Signs BP 133/78 (BP Location: Left Arm)   Pulse 89   Temp 98.2 F (36.8 C) (Oral)   Resp 14   Ht 5' 7"$  (1.702 m)   Wt 124 lb (56.2 kg)   SpO2 100%   BMI 19.42 kg/m   Physical Exam Vitals and nursing note reviewed.  Constitutional:      General: She is not in acute distress.    Appearance: Normal appearance. She is not ill-appearing or toxic-appearing.  HENT:     Head: Normocephalic and atraumatic.     Right Ear: Tympanic membrane, ear canal and external ear normal.     Left Ear: Tympanic membrane, ear canal and external ear normal.     Nose: Congestion present.     Mouth/Throat:     Mouth: Mucous membranes are moist.     Pharynx: Oropharynx is clear.  Eyes:     General: No scleral icterus.       Right eye: No discharge.        Left eye: No discharge.     Conjunctiva/sclera: Conjunctivae normal.  Cardiovascular:     Rate and Rhythm: Normal rate and regular rhythm.     Heart sounds: Normal heart sounds.  Pulmonary:     Effort: Pulmonary effort is normal. No respiratory distress.     Breath sounds: Normal breath sounds.  Musculoskeletal:     Cervical back: Neck supple.  Skin:    General: Skin is dry.  Neurological:     General: No focal deficit present.     Mental Status: She is alert.  Mental status is at baseline.     Motor: No weakness.     Gait: Gait normal.  Psychiatric:        Mood and Affect: Mood  normal.        Behavior: Behavior normal.        Thought Content: Thought content normal.      UC Treatments / Results  Labs (all labs ordered are listed, but only abnormal results are displayed) Labs Reviewed  RESP PANEL BY RT-PCR (RSV, FLU A&B, COVID)  [id number] - Abnormal; Notable for the following components:      Result Value   SARS Coronavirus 2 by RT PCR POSITIVE (*)    All other components within normal limits    EKG   Radiology No results found.  Procedures Procedures (including critical care time)  Medications Ordered in UC Medications - No data to display  Initial Impression / Assessment and Plan / UC Course  I have reviewed the triage vital signs and the nursing notes.  Pertinent labs & imaging results that were available during my care of the patient were reviewed by me and considered in my medical decision making (see chart for details).   88 year old female presents for 3-day history of cough, congestion, sinus pressure and headaches.  Denies fever or breathing difficulty.  No weakness.  Tried Levaquin and sinus rinses without relief.  No sick contacts or known exposure to COVID-19.  Vitals are normal and stable and she is overall well-appearing.  On exam she has nasal congestion.  Throat is clear.  Chest clear auscultation heart regular rate and rhythm.  Respiratory panel positive for COVID-19.  GFR in December was 55.  Will prescribe Paxlovid renal dosing for patient.  Will have her hold her Crestor for 10 days.  Also sent benzonatate.  Encouraged increasing rest and fluids and continue with sinus rinses.  Reviewed current CDC guidelines, isolation protocol and ED precautions.   Final Clinical Impressions(s) / UC Diagnoses   Final diagnoses:  COVID-19  Acute nonintractable headache, unspecified headache type  Acute cough      Discharge Instructions      -You are positive for COVID-19. -Isolate until Monday and then wear mask x 5 days. -I have sent Paxlovid to the pharmacy.  You will need to hold your Crestor medication for 10 days from the day of the start the Paxlovid medicine. - Increase rest and fluids. - Need to be seen again if you develop fever greater than 101, increased fatigue/weakness or breathing difficulty.     ED Prescriptions     Medication Sig Dispense Auth. Provider   nirmatrelvir & ritonavir (PAXLOVID, 150/100,) 10 x 150 MG & 10 x 100MG TBPK GFR 55. Take Paxlovid renal dose x 5 days 1 each Carlyon Prows, Prentiss Hammett B, PA-C   benzonatate (TESSALON) 200 MG capsule Take 1 capsule (200 mg total) by mouth 3 (three) times daily as needed for cough. 30 capsule Danton Clap, PA-C      PDMP not reviewed this encounter.   Danton Clap, PA-C 10/06/22 1104

## 2022-10-06 NOTE — Discharge Instructions (Addendum)
-  You are positive for COVID-19. -Isolate until Monday and then wear mask x 5 days. -I have sent Paxlovid to the pharmacy.  You will need to hold your Crestor medication for 10 days from the day of the start the Paxlovid medicine. - Increase rest and fluids. - Need to be seen again if you develop fever greater than 101, increased fatigue/weakness or breathing difficulty.

## 2022-11-04 ENCOUNTER — Ambulatory Visit
Admission: EM | Admit: 2022-11-04 | Discharge: 2022-11-04 | Disposition: A | Discharge status: Home / Self Care | Payer: Medicare HMO | Attending: Physician Assistant | Admitting: Physician Assistant

## 2022-11-04 ENCOUNTER — Encounter: Payer: Self-pay | Admitting: Emergency Medicine

## 2022-11-04 DIAGNOSIS — J029 Acute pharyngitis, unspecified: Secondary | ICD-10-CM

## 2022-11-04 DIAGNOSIS — R0981 Nasal congestion: Secondary | ICD-10-CM | POA: Diagnosis not present

## 2022-11-04 DIAGNOSIS — J069 Acute upper respiratory infection, unspecified: Secondary | ICD-10-CM | POA: Diagnosis not present

## 2022-11-04 LAB — RAPID INFLUENZA A&B ANTIGENS
Influenza A (ARMC): NEGATIVE
Influenza B (ARMC): NEGATIVE

## 2022-11-04 LAB — GROUP A STREP BY PCR: Group A Strep by PCR: NOT DETECTED

## 2022-11-04 MED ORDER — IPRATROPIUM BROMIDE 0.06 % NA SOLN: 2.0000 | Freq: Four times a day (QID) | NASAL | 0 refills | Status: AC

## 2022-11-04 MED ORDER — BENZONATATE 200 MG PO CAPS: 200.0000 mg | ORAL_CAPSULE | Freq: Three times a day (TID) | ORAL | 0 refills | Status: DC | PRN

## 2022-11-04 MED ORDER — LIDOCAINE VISCOUS HCL 2 % MT SOLN: 15.0000 mL | OROMUCOSAL | 0 refills | Status: AC | PRN

## 2022-11-04 NOTE — ED Triage Notes (Signed)
Patient c/o sinus pressure, sore throat, nasal congestion and headache that started 2 days ago.  Patient unsure of fevers.

## 2022-11-04 NOTE — ED Provider Notes (Signed)
[facility]    CSN: [id number] Arrival date & time: 11/04/22  [id number]      History   Chief Complaint Chief Complaint  Patient presents with   Sinus Problem   Sore Throat    HPI Judy Jenkins is a 88 y.o. female presenting for approximately 2-day history of sore throat, fatigue, sinus pressure, nasal congestion, and right eye drainage.  She denies fever.  Reports temperatures have been about 99 degrees.  Denies weakness, cough, chest pain, shortness of breath, vomiting or diarrhea.  No sick contacts.  Has taken Tylenol. Not taking any cough medicines. History of COVID 19 one month ago.  Denies any other complaints or concerns.  HPI  Past Medical History:  Diagnosis Date   Cancer ([place])    Tachycardia     Patient Active Problem List   Diagnosis Date Noted   Positive D dimer 02/13/2022   Grade I diastolic dysfunction, LVH 02/13/2022   Dizziness 02/12/2022   Pain in limb 12/22/2021   Swelling of limb 12/22/2021   Breast cancer ([place]) 12/22/2021   Orthostatic hypotension 02/03/2019   Acquired hypothyroidism 04/26/2011   Anxiety 04/26/2011   GERD (gastroesophageal reflux disease) w/ large hiatal hernia  04/26/2011    Past Surgical History:  Procedure Laterality Date   ABDOMINAL HYSTERECTOMY     BREAST SURGERY     CHOLECYSTECTOMY      OB History   No obstetric history on file.      Home Medications    Prior to Admission medications   Medication Sig Start Date End Date Taking? Authorizing Provider  ipratropium (ATROVENT) 0.06 % nasal spray Place 2 sprays into both nostrils 4 (four) times daily. 11/04/22  Yes Laurene Footman B, PA-C  lidocaine (XYLOCAINE) 2 % solution Use as directed 15 mLs in the mouth or throat every 3 (three) hours as needed for mouth pain (swish and spit). 11/04/22  Yes Laurene Footman B, PA-C  amitriptyline (ELAVIL) 10 MG tablet Take 10 mg by mouth at bedtime.    [provider]  aspirin EC 81 MG tablet Take 1 tablet (81 mg  total) by mouth daily. 02/14/22   Emeterio Reeve, DO  benzonatate (TESSALON) 200 MG capsule Take 1 capsule (200 mg total) by mouth 3 (three) times daily as needed for cough. 11/04/22   Danton Clap, PA-C  Biotin 1 MG CAPS Take by mouth. Patient not taking: Reported on 02/13/2022    [provider]  Cholecalciferol 25 MCG (1000 UT) capsule Take 1,000 Units by mouth daily.    [provider]  fludrocortisone (FLORINEF) 0.1 MG tablet Take 0.05 mg by mouth daily. 10/11/21   [provider]  furosemide (LASIX) 20 MG tablet Take 20 mg by mouth daily. Patient not taking: Reported on 02/13/2022 11/16/21   [provider]  letrozole (FEMARA) 2.5 MG tablet Take 2.5 mg by mouth daily. 09/16/21   [provider]  levothyroxine (SYNTHROID) 75 MCG tablet Take 75 mcg by mouth daily. 01/30/22   [provider]  metoprolol succinate (TOPROL-XL) 25 MG 24 hr tablet Take 25 mg by mouth daily.    [provider]  nirmatrelvir & ritonavir (PAXLOVID, 150/100,) 10 x 150 MG & 10 x 100MG  TBPK GFR 55. Take Paxlovid renal dose x 5 days 10/06/22   Laurene Footman B, PA-C  omeprazole (PRILOSEC OTC) 20 MG tablet Prilosec 20 mg capsule,delayed release  Take 1 capsule every day by oral route.    [provider]  rosuvastatin (CRESTOR) 10 MG tablet Take 1 tablet (10 mg total) by mouth daily. 02/13/22   Emeterio Reeve, DO  Vitamin E 268 MG (400 UNIT) CAPS Take 400 Units by mouth daily.    [provider]    Family History Family History  Problem Relation Age of Onset   CAD Mother    CAD Father     Social History Social History   Tobacco Use   Smoking status: Never   Smokeless tobacco: Never  Vaping Use   Vaping Use: Never used  Substance Use Topics   Alcohol use: No   Drug use: No     Allergies   Avelox [moxifloxacin hcl in nacl], Ciprofloxacin, Biaxin [clarithromycin], Atorvastatin, Codeine, and Sulfa antibiotics   Review of  Systems Review of Systems  Constitutional:  Positive for fatigue. Negative for chills, diaphoresis and fever.  HENT:  Positive for congestion, rhinorrhea, sinus pressure and sore throat. Negative for ear pain and sinus pain.   Eyes:  Positive for discharge. Negative for pain and redness.  Respiratory:  Negative for cough and shortness of breath.   Cardiovascular:  Negative for chest pain.  Gastrointestinal:  Negative for abdominal pain, nausea and vomiting.  Musculoskeletal:  Negative for myalgias.  Skin:  Negative for rash.  Neurological:  Negative for weakness and headaches.  Hematological:  Negative for adenopathy.     Physical Exam Triage Vital Signs ED Triage Vitals  Enc Vitals Group     BP      Pulse      Resp      Temp      Temp src      SpO2      Weight      Height      Head Circumference      Peak Flow      Pain Score      Pain Loc      Pain Edu?      Excl. in [place]?    No data found.  Updated Vital Signs BP (!) 169/81 (BP Location: Right Arm)   Pulse 71   Temp 98.3 F (36.8 C) (Oral)   Resp 14   Ht 5\' 7"  (1.702 m)   Wt 123 lb 14.4 oz (56.2 kg)   SpO2 96%   BMI 19.41 kg/m   Physical Exam Vitals and nursing note reviewed.  Constitutional:      General: She is not in acute distress.    Appearance: Normal appearance. She is not ill-appearing or toxic-appearing.  HENT:     Head: Normocephalic and atraumatic.     Right Ear: Tympanic membrane, ear canal and external ear normal.     Left Ear: Tympanic membrane, ear canal and external ear normal.     Nose: Congestion present.     Mouth/Throat:     Mouth: Mucous membranes are moist.     Pharynx: Oropharynx is clear. Posterior oropharyngeal erythema present.  Eyes:     General: No scleral icterus.       Right eye: No discharge.        Left eye: No discharge.     Conjunctiva/sclera: Conjunctivae normal.  Cardiovascular:     Rate and Rhythm: Normal rate and regular rhythm.     Heart sounds: Normal heart  sounds.  Pulmonary:     Effort: Pulmonary effort is normal. No respiratory distress.     Breath sounds: Normal breath sounds.  Musculoskeletal:     Cervical back: Neck supple.  Skin:    General: Skin is dry.  Neurological:     General: No focal deficit present.     Mental Status: She is alert. Mental status is at baseline.     Motor: No weakness.     Gait: Gait normal.  Psychiatric:        Mood and Affect: Mood normal.        Behavior: Behavior normal.        Thought Content: Thought content normal.      UC Treatments / Results  Labs (all labs ordered are listed, but only abnormal results are displayed) Labs Reviewed  GROUP A STREP BY PCR  RAPID INFLUENZA A&B ANTIGENS     EKG   Radiology No results found.  Procedures Procedures (including critical care time)  Medications Ordered in UC Medications - No data to display  Initial Impression / Assessment and Plan / UC Course  I have reviewed the triage vital signs and the nursing notes.  Pertinent labs & imaging results that were available during my care of the patient were reviewed by me and considered in my medical decision making (see chart for details).   88 year old female presents for 2-day history of sore throat, congestion, sinus pressure and fatigue.  Denies fever, cough or breathing difficulty.  No weakness. Taking Tylenol. No sick contacts or known exposure to flu/strep. History of COVID-19 one month ago.  She is overall well-appearing.  On exam she has nasal congestion.  Throat with mild erythema.  Chest clear to auscultation and heart regular rate and rhythm.  PCR strep and rapid flu testing performed.  All negative.  Discussed results with patient.  Advised patient she has viral URI versus allergies.  Husband says that she has been helping him most recently right before symptoms began.  She does not take any daily antihistamines and I encouraged her to try Claritin.  Advised patient I sent viscous  lidocaine to the pharmacy for her throat as well as Atrovent nasal spray and benzonatate in case develops a cough.  Encouraged increasing rest and fluids and continue with sinus rinses.  Reviewed return as needed if not improving over the next 1 to 10 days or if symptoms worsen.  Final Clinical Impressions(s) / UC Diagnoses   Final diagnoses:  Viral upper respiratory tract infection  Sore throat  Nasal congestion     Discharge Instructions      -Negative strep and flu testing.  -You have a viral  upper respiratory infection that should clear in 7-10 days. -May take OTC cough medication or the prescriptions -Increase rest and fluids.  -May take Tylenol for any aches -Return if high fever, worsening sinus pressure after a week, breathing difficulty, increased weakness.       ED Prescriptions     Medication Sig Dispense Auth. Provider   benzonatate (TESSALON) 200 MG capsule Take 1 capsule (200 mg total) by mouth 3 (three) times daily as needed for cough. 30 capsule Laurene Footman B, PA-C   lidocaine (XYLOCAINE) 2 % solution Use as directed 15 mLs in the mouth or throat every 3 (three) hours as needed for mouth pain (swish and spit). 100 mL Laurene Footman B, PA-C   ipratropium (ATROVENT) 0.06 % nasal spray Place 2 sprays into both nostrils 4 (four) times daily. 15 mL Danton Clap, PA-C      PDMP not reviewed this encounter.     Danton Clap, PA-C 11/04/22 1110

## 2022-11-04 NOTE — Discharge Instructions (Signed)
-  Negative strep and flu testing.  -You have a viral  upper respiratory infection that should clear in 7-10 days. -May take OTC cough medication or the prescriptions -Increase rest and fluids.  -May take Tylenol for any aches -Return if high fever, worsening sinus pressure after a week, breathing difficulty, increased weakness.

## 2022-12-06 ENCOUNTER — Ambulatory Visit
Admission: EM | Admit: 2022-12-06 | Discharge: 2022-12-06 | Disposition: A | Discharge status: Home / Self Care | Payer: Medicare HMO | Attending: Emergency Medicine | Admitting: Emergency Medicine

## 2022-12-06 DIAGNOSIS — L03113 Cellulitis of right upper limb: Secondary | ICD-10-CM

## 2022-12-06 MED ORDER — CEPHALEXIN 500 MG PO CAPS: 500.0000 mg | ORAL_CAPSULE | Freq: Two times a day (BID) | ORAL | 0 refills | Status: AC

## 2022-12-06 NOTE — ED Triage Notes (Signed)
Pt states she did some pressure washing and digging yesterday, pt states she woke up in the middle of the night itching RT upper arm, pt states area red this morning, warm to the touch. Pt states she has been applying ice to the area

## 2022-12-06 NOTE — Discharge Instructions (Addendum)
Appearance of your arm today is consistent with infection  Begin Keflex every morning and every evening for 5 days  May continue to use ice or heat over the affected area for comfort and to help reduce swelling  When sitting and lying elevate arm on pillow to help reduce swelling  For any discomfort you may take Tylenol as needed every 6 hours  You may follow-up with his urgent care or your primary doctor for reevaluation as needed

## 2022-12-06 NOTE — ED Provider Notes (Signed)
[facility]    CSN: [id number] Arrival date & time: 12/06/22  1200      History   Chief Complaint Chief Complaint  Patient presents with   Rash    HPI Judy Jenkins is a 88 y.o. female.   Patient presents for evaluation erythema, swelling and heat to the touch to the right upper arm beginning 1 day ago.  Endorses that she did pressure washing and yard work prior to symptoms beginning.  Was awoken in the middle of the night to severe pruritus which has calmed down this morning.  Has been applying ice over the affected area.  Denies fevers or drainage.  Denies injury or trauma.  Taking daily blood thinner.  Past Medical History:  Diagnosis Date   Cancer    Tachycardia     Patient Active Problem List   Diagnosis Date Noted   Positive D dimer 02/13/2022   Grade I diastolic dysfunction, LVH 02/13/2022   Dizziness 02/12/2022   Pain in limb 12/22/2021   Swelling of limb 12/22/2021   Breast cancer 12/22/2021   Orthostatic hypotension 02/03/2019   Acquired hypothyroidism 04/26/2011   Anxiety 04/26/2011   GERD (gastroesophageal reflux disease) w/ large hiatal hernia  04/26/2011    Past Surgical History:  Procedure Laterality Date   ABDOMINAL HYSTERECTOMY     BREAST SURGERY     CHOLECYSTECTOMY      OB History   No obstetric history on file.      Home Medications    Prior to Admission medications   Medication Sig Start Date End Date Taking? Authorizing Provider  cephALEXin (KEFLEX) 500 MG capsule Take 1 capsule (500 mg total) by mouth 2 (two) times daily for 5 days. 12/06/22 12/11/22 Yes Taariq Leitz, Elita Boone, NP  amitriptyline (ELAVIL) 10 MG tablet Take 10 mg by mouth at bedtime.    [provider]  aspirin EC 81 MG tablet Take 1 tablet (81 mg total) by mouth daily. 02/14/22   Sunnie Nielsen, DO  benzonatate (TESSALON) 200 MG capsule Take 1 capsule (200 mg total) by mouth 3 (three) times daily as needed for cough. 11/04/22   Shirlee Latch,  PA-C  Biotin 1 MG CAPS Take by mouth. Patient not taking: Reported on 02/13/2022    [provider]  Cholecalciferol 25 MCG (1000 UT) capsule Take 1,000 Units by mouth daily.    [provider]  fludrocortisone (FLORINEF) 0.1 MG tablet Take 0.05 mg by mouth daily. 10/11/21   [provider]  furosemide (LASIX) 20 MG tablet Take 20 mg by mouth daily. Patient not taking: Reported on 02/13/2022 11/16/21   [provider]  ipratropium (ATROVENT) 0.06 % nasal spray Place 2 sprays into both nostrils 4 (four) times daily. 11/04/22   Shirlee Latch, PA-C  letrozole [facility]) 2.5 MG tablet Take 2.5 mg by mouth daily. 09/16/21   [provider]  levothyroxine (SYNTHROID) 75 MCG tablet Take 75 mcg by mouth daily. 01/30/22   [provider]  lidocaine (XYLOCAINE) 2 % solution Use as directed 15 mLs in the mouth or throat every 3 (three) hours as needed for mouth pain (swish and spit). 11/04/22   Eusebio Friendly B, PA-C  metoprolol succinate (TOPROL-XL) 25 MG 24 hr tablet Take 25 mg by mouth daily.    [provider]  nirmatrelvir & ritonavir (PAXLOVID, 150/100,) 10 x 150 MG & 10 x  TBPK GFR 55. Take Paxlovid renal dose x 5 days 10/06/22   Eusebio Friendly  B, PA-C  omeprazole (PRILOSEC OTC) 20 MG tablet Prilosec 20 mg capsule,delayed release  Take 1 capsule every day by oral route.    [provider]  rosuvastatin (CRESTOR) 10 MG tablet Take 1 tablet (10 mg total) by mouth daily. 02/13/22   Sunnie Nielsen, DO  Vitamin E 268 MG (400 UNIT) CAPS Take 400 Units by mouth daily.    [provider]    Family History Family History  Problem Relation Age of Onset   CAD Mother    CAD Father     Social History Social History   Tobacco Use   Smoking status: Never   Smokeless tobacco: Never  Vaping Use   Vaping Use: Never used  Substance Use Topics   Alcohol use: No   Drug use: No     Allergies   Avelox [moxifloxacin hcl in  nacl], Ciprofloxacin, Biaxin [clarithromycin], Atorvastatin, Codeine, and Sulfa antibiotics   Review of Systems Review of Systems  Skin:  Positive for rash.     Physical Exam Triage Vital Signs ED Triage Vitals  Enc Vitals Group     BP 12/06/22 1213 (!) 147/72     Pulse Rate 12/06/22 1213 75     Resp --      Temp 12/06/22 1213 98.6 F (37 C)     Temp Source 12/06/22 1213 Oral     SpO2 12/06/22 1213 96 %     Weight 12/06/22 1204 123 lb 14.4 oz (56.2 kg)     Height 12/06/22 1204 5\' 7"  (1.702 m)     Head Circumference --      Peak Flow --      Pain Score 12/06/22 1212 0     Pain Loc --      Pain Edu? --      Excl. in [place]? --    No data found.  Updated Vital Signs BP (!) 147/72 (BP Location: Left Arm)   Pulse 75   Temp 98.6 F (37 C) (Oral)   Ht 5\' 7"  (1.702 m)   Wt 123 lb 14.4 oz (56.2 kg)   SpO2 96%   BMI 19.41 kg/m   Visual Acuity Right Eye Distance:   Left Eye Distance:   Bilateral Distance:    Right Eye Near:   Left Eye Near:    Bilateral Near:     Physical Exam Constitutional:      Appearance: Normal appearance.  Eyes:     Extraocular Movements: Extraocular movements intact.  Pulmonary:     Effort: Pulmonary effort is normal.  Skin:    Comments: Erythema and mild to moderate swelling present to the anterior of the right upper extremity, skin is warm to touch, no tenderness, drainage noted, skin intact, scattered ecchymosis  Neurological:     Mental Status: She is alert and oriented to person, place, and time. Mental status is at baseline.      UC Treatments / Results  Labs (all labs ordered are listed, but only abnormal results are displayed) Labs Reviewed - No data to display  EKG   Radiology No results found.  Procedures Procedures (including critical care time)  Medications Ordered in UC Medications - No data to display  Initial Impression / Assessment and Plan / UC Course  I have reviewed the triage vital signs and the nursing  notes.  Pertinent labs & imaging results that were available during my care of the patient were reviewed by me and considered in my medical decision making (see  chart for details).  Cellulitis of right arm  Presentation is consistent with infection, discussed with patient, Keflex prescribed and discussed administration, recommended supportive measures through ice, heat, elevation and Tylenol, advised follow-up if symptoms continue to persist or worsen for reevaluation Final Clinical Impressions(s) / UC Diagnoses   Final diagnoses:  Cellulitis of right arm     Discharge Instructions      Appearance of your arm today is consistent with infection  Begin Keflex every morning and every evening for 5 days  May continue to use ice or heat over the affected area for comfort and to help reduce swelling  When sitting and lying elevate arm on pillow to help reduce swelling  For any discomfort you may take Tylenol as needed every 6 hours  You may follow-up with his urgent care or your primary doctor for reevaluation as needed   ED Prescriptions     Medication Sig Dispense Auth. Provider   cephALEXin (KEFLEX) 500 MG capsule Take 1 capsule (500 mg total) by mouth 2 (two) times daily for 5 days. 10 capsule Valinda Hoar, NP      PDMP not reviewed this encounter.   Valinda Hoar, NP 12/06/22 1234

## 2022-12-07 ENCOUNTER — Ambulatory Visit: Payer: Medicare HMO

## 2023-02-26 ENCOUNTER — Other Ambulatory Visit: Payer: Self-pay | Admitting: Student

## 2023-02-26 DIAGNOSIS — R519 Headache, unspecified: Secondary | ICD-10-CM

## 2023-02-27 ENCOUNTER — Encounter: Payer: Self-pay | Admitting: Student

## 2023-02-28 ENCOUNTER — Encounter: Payer: Self-pay | Admitting: Student

## 2023-03-02 ENCOUNTER — Ambulatory Visit
Admission: RE | Admit: 2023-03-02 | Discharge: 2023-03-02 | Disposition: A | Discharge status: Home / Self Care | Payer: Medicare HMO | Source: Ambulatory Visit | Attending: Student | Admitting: Student

## 2023-03-02 DIAGNOSIS — R519 Headache, unspecified: Secondary | ICD-10-CM

## 2023-03-15 ENCOUNTER — Ambulatory Visit (INDEPENDENT_AMBULATORY_CARE_PROVIDER_SITE_OTHER): Payer: Medicare HMO

## 2023-03-15 ENCOUNTER — Ambulatory Visit
Admission: RE | Admit: 2023-03-15 | Discharge: 2023-03-15 | Disposition: A | Discharge status: Home / Self Care | Payer: Medicare HMO | Source: Ambulatory Visit | Attending: Emergency Medicine | Admitting: Emergency Medicine

## 2023-03-15 VITALS — BP 169/92 | HR 70 | Temp 98.6°F | Ht 67.0 in | Wt 120.0 lb

## 2023-03-15 DIAGNOSIS — T148XXA Other injury of unspecified body region, initial encounter: Secondary | ICD-10-CM

## 2023-03-15 DIAGNOSIS — R0781 Pleurodynia: Secondary | ICD-10-CM

## 2023-03-15 DIAGNOSIS — R0789 Other chest pain: Secondary | ICD-10-CM

## 2023-03-15 MED ORDER — BACLOFEN 5 MG PO TABS: 1.0000 | ORAL_TABLET | Freq: Two times a day (BID) | ORAL | 0 refills | Status: AC

## 2023-03-15 NOTE — ED Provider Notes (Signed)
[facility]    CSN: [id number] Arrival date & time: 03/15/23  1322      History   Chief Complaint Chief Complaint  Patient presents with   Flank Pain    HPI Judy Jenkins is a 88 y.o. female.   88 year old female, Judy Jenkins, present sot urgent care for evaluation of left sided rib pain after picking up a deep for and hearing a loud pop", patient reports pain to left lateral rib since.  No bruising, no rash  The history is provided by the patient and the spouse. No language interpreter was used.    Past Medical History:  Diagnosis Date   Cancer [facility])    Tachycardia     Patient Active Problem List   Diagnosis Date Noted   Positive D dimer 02/13/2022   Grade I diastolic dysfunction, LVH 02/13/2022   Dizziness 02/12/2022   Pain in limb 12/22/2021   Swelling of limb 12/22/2021   Breast cancer (HCC) 12/22/2021   Orthostatic hypotension 02/03/2019   Acquired hypothyroidism 04/26/2011   Anxiety 04/26/2011   GERD (gastroesophageal reflux disease) w/ large hiatal hernia  04/26/2011    Past Surgical History:  Procedure Laterality Date   ABDOMINAL HYSTERECTOMY     BREAST SURGERY     CHOLECYSTECTOMY      OB History   No obstetric history on file.      Home Medications    Prior to Admission medications   Medication Sig Start Date End Date Taking? Authorizing Provider  amitriptyline (ELAVIL) 10 MG tablet Take 10 mg by mouth at bedtime.   Yes [provider]  aspirin EC 81 MG tablet Take 1 tablet (81 mg total) by mouth daily. 02/14/22  Yes Sunnie Nielsen, DO  Biotin 1 MG CAPS Take by mouth.   Yes [provider]  Cholecalciferol 25 MCG (1000 UT) capsule Take 1,000 Units by mouth daily.   Yes [provider]  fludrocortisone (FLORINEF) 0.1 MG tablet Take 0.05 mg by mouth daily. 10/11/21  Yes [provider]  letrozole (FEMARA) 2.5 MG tablet Take 2.5 mg by mouth daily. 09/16/21  Yes [provider]   levothyroxine (SYNTHROID) 75 MCG tablet Take 75 mcg by mouth daily. 01/30/22  Yes [provider]  lidocaine (XYLOCAINE) 2 % solution Use as directed 15 mLs in the mouth or throat every 3 (three) hours as needed for mouth pain (swish and spit). 11/04/22  Yes Eusebio Friendly B, PA-C  metoprolol succinate (TOPROL-XL) 25 MG 24 hr tablet Take 25 mg by mouth daily.   Yes [provider]  omeprazole (PRILOSEC OTC) 20 MG tablet Prilosec 20 mg capsule,delayed release  Take 1 capsule every day by oral route.   Yes [provider]  rosuvastatin (CRESTOR) 10 MG tablet Take 1 tablet (10 mg total) by mouth daily. 02/13/22  Yes Sunnie Nielsen, DO  benzonatate (TESSALON) 200 MG capsule Take 1 capsule (200 mg total) by mouth 3 (three) times daily as needed for cough. 11/04/22   Shirlee Latch, PA-C  furosemide (LASIX) 20 MG tablet Take 20 mg by mouth daily. Patient not taking: Reported on 02/13/2022 11/16/21   [provider]  ipratropium (ATROVENT) 0.06 % nasal spray Place 2 sprays into both nostrils 4 (four) times daily. 11/04/22   Shirlee Latch, PA-C  nirmatrelvir & ritonavir (PAXLOVID, 150/100,) 10 x 150 MG & 10 x 100MG  TBPK GFR 55. Take Paxlovid renal dose x 5 days 10/06/22   Eusebio Friendly  B, PA-C  Vitamin E 268 MG (400 UNIT) CAPS Take 400 Units by mouth daily.    [provider]    Family History Family History  Problem Relation Age of Onset   CAD Mother    CAD Father     Social History Social History   Tobacco Use   Smoking status: Never   Smokeless tobacco: Never  Vaping Use   Vaping status: Never Used  Substance Use Topics   Alcohol use: No   Drug use: No     Allergies   Avelox [moxifloxacin hcl in nacl], Ciprofloxacin, Biaxin [clarithromycin], Atorvastatin, Codeine, and Sulfa antibiotics   Review of Systems Review of Systems  Respiratory: Negative.    Musculoskeletal:  Positive for myalgias.  Skin: Negative.   All other systems  reviewed and are negative.    Physical Exam Triage Vital Signs ED Triage Vitals  Encounter Vitals Group     BP      Systolic BP Percentile      Diastolic BP Percentile      Pulse      Resp      Temp      Temp src      SpO2      Weight      Height      Head Circumference      Peak Flow      Pain Score      Pain Loc      Pain Education      Exclude from Growth Chart    No data found.  Updated Vital Signs BP (!) 169/92 (BP Location: Left Arm)   Pulse 70   Temp 98.6 F (37 C) (Oral)   Ht 5\' 7"  (1.702 m)   Wt 120 lb (54.4 kg)   SpO2 98%   BMI 18.79 kg/m   Visual Acuity Right Eye Distance:   Left Eye Distance:   Bilateral Distance:    Right Eye Near:   Left Eye Near:    Bilateral Near:     Physical Exam Vitals and nursing note reviewed.  Constitutional:      General: She is not in acute distress.    Appearance: She is well-developed and well-groomed.  HENT:     Head: Normocephalic and atraumatic.  Eyes:     Conjunctiva/sclera: Conjunctivae normal.  Neck:     Trachea: Trachea normal.  Cardiovascular:     Rate and Rhythm: Normal rate and regular rhythm.     Pulses: Normal pulses.     Heart sounds: Normal heart sounds. No murmur heard. Pulmonary:     Effort: Pulmonary effort is normal. No respiratory distress.     Breath sounds: Normal breath sounds and air entry.  Chest:    Abdominal:     Palpations: Abdomen is soft.     Tenderness: There is no abdominal tenderness.  Musculoskeletal:        General: No swelling.     Cervical back: Normal range of motion and neck supple.  Skin:    General: Skin is warm and dry.     Capillary Refill: Capillary refill takes less than 2 seconds.  Neurological:     General: No focal deficit present.     Mental Status: She is alert and oriented to person, place, and time.     GCS: GCS eye subscore is 4. GCS verbal subscore is 5. GCS motor subscore is 6.  Psychiatric:        Attention and Perception:  Attention  normal.        Mood and Affect: Mood normal.        Speech: Speech normal.        Behavior: Behavior normal. Behavior is cooperative.      UC Treatments / Results  Labs (all labs ordered are listed, but only abnormal results are displayed) Labs Reviewed - No data to display  EKG   Radiology No results found.  Procedures Procedures (including critical care time)  Medications Ordered in UC Medications - No data to display  Initial Impression / Assessment and Plan / UC Course  I have reviewed the triage vital signs and the nursing notes.  Pertinent labs & imaging results that were available during my care of the patient were reviewed by me and considered in my medical decision making (see chart for details).  Clinical Course as of 03/15/23 1521  Thu Mar 15, 2023  1346 Left ribs, pt picked up deep fryer and heard pop on left rib area, concerned she may have broken something. Tylenol offered for pain, pt declined. [JD]  1508 Pt awaiting xray result, radiology called earlier for result(aware of delay in read times). [JD]  1510 Old fractures 6th,7,8th ribs, hiatal hernia, arteriosclerosis, no new/acute fractures noted. [JD]    Clinical Course User Index [JD] Durell Lofaso, Para March, NP    Ddx: Left rib strain,fracture Final Clinical Impressions(s) / UC Diagnoses   Final diagnoses:  None     Discharge Instructions      May use over the counter tylenol for pain as label directed.  May use lidocaine patch or biofreeze over the counter as label directed.  Ice to left rib area 20 min 3 x daily for first 24 hours, then can switch to heat: (make sure to not apply ice or heat directly to skin).      ED Prescriptions   None    PDMP not reviewed this encounter.   Clancy Gourd, NP 03/15/23 1523

## 2023-03-15 NOTE — ED Triage Notes (Signed)
Pt c/o left side pain starting at 10am today.  Pt states that she was picking up a deep fryer and was trying to lift it and heard a loud pop in her left side. Pt feels like something broke.  Pt has no bruising along the left flank and skin is cool to touch.

## 2023-03-15 NOTE — Discharge Instructions (Addendum)
Your x-ray was negative for new acute fractures, most likely you pulled a muscle, baclofen(muscle relaxer) as prescribed. May use over the counter tylenol for pain as label directed.  May use lidocaine patch or biofreeze over the counter as label directed.  Ice to left rib area 20 min 3 x daily for first 24 hours, then can switch to heat: (make sure to not apply ice or heat directly to skin).   If you develop chest pain, palpitations, shortness of breath go to the ER for further evaluation.

## 2023-04-23 ENCOUNTER — Ambulatory Visit
Admission: EM | Admit: 2023-04-23 | Discharge: 2023-04-23 | Disposition: A | Discharge status: Home / Self Care | Payer: Medicare HMO | Attending: Family Medicine | Admitting: Family Medicine

## 2023-04-23 DIAGNOSIS — H9393 Unspecified disorder of ear, bilateral: Secondary | ICD-10-CM | POA: Diagnosis present

## 2023-04-23 DIAGNOSIS — H6993 Unspecified Eustachian tube disorder, bilateral: Secondary | ICD-10-CM | POA: Diagnosis present

## 2023-04-23 DIAGNOSIS — J029 Acute pharyngitis, unspecified: Secondary | ICD-10-CM | POA: Insufficient documentation

## 2023-04-23 DIAGNOSIS — Z1152 Encounter for screening for COVID-19: Secondary | ICD-10-CM | POA: Diagnosis not present

## 2023-04-23 LAB — SARS CORONAVIRUS 2 BY RT PCR: SARS Coronavirus 2 by RT PCR: NEGATIVE

## 2023-04-23 LAB — GROUP A STREP BY PCR: Group A Strep by PCR: NOT DETECTED

## 2023-04-23 MED ORDER — DEXAMETHASONE SODIUM PHOSPHATE 10 MG/ML IJ SOLN
10.0000 mg | Freq: Once | INTRAMUSCULAR | Status: AC
  Administered 2023-04-23: 10 mg via INTRAMUSCULAR

## 2023-04-23 MED ORDER — CEPHALEXIN 500 MG PO CAPS: 500.0000 mg | ORAL_CAPSULE | Freq: Three times a day (TID) | ORAL | 0 refills | Status: DC

## 2023-04-23 NOTE — Discharge Instructions (Addendum)
Stop by the pharmacy to pick up your prescriptions.  Follow up with your ear, nose, and throat doctor if symptoms are not improving over the next week.

## 2023-04-23 NOTE — ED Provider Notes (Signed)
[facility]    CSN: [id number] Arrival date & time: 04/23/23  1116      History   Chief Complaint Chief Complaint  Patient presents with   Sore Throat   Otalgia    HPI Nasrin Insley is a 88 y.o. female.   HPI  History obtained from the patient and her husband  Bennita presents for bilateral ear pain and sore throat.  She is having some popping in her ears but this has been going on for quite a while.  She follows with ENT, Dr. Elenore Rota no known sick contacts. She is having rhinorrhea and has a sore in her left nare. Denies fever, cough, vomiting or diarrhea.         Past Medical History:  Diagnosis Date   Cancer [facility])    Tachycardia     Patient Active Problem List   Diagnosis Date Noted   Rib pain on left side 03/15/2023   Muscle strain 03/15/2023   Positive D dimer 02/13/2022   Grade I diastolic dysfunction, LVH 02/13/2022   Dizziness 02/12/2022   Pain in limb 12/22/2021   Swelling of limb 12/22/2021   Breast cancer (HCC) 12/22/2021   Orthostatic hypotension 02/03/2019   Acquired hypothyroidism 04/26/2011   Anxiety 04/26/2011   GERD (gastroesophageal reflux disease) w/ large hiatal hernia  04/26/2011    Past Surgical History:  Procedure Laterality Date   ABDOMINAL HYSTERECTOMY     BREAST SURGERY     CHOLECYSTECTOMY      OB History   No obstetric history on file.      Home Medications    Prior to Admission medications   Medication Sig Start Date End Date Taking? Authorizing Provider  cephALEXin (KEFLEX) 500 MG capsule Take 1 capsule (500 mg total) by mouth 3 (three) times daily. 04/23/23  Yes Eion Timbrook, DO  amitriptyline (ELAVIL) 10 MG tablet Take 10 mg by mouth at bedtime.    [provider]  aspirin EC 81 MG tablet Take 1 tablet (81 mg total) by mouth daily. 02/14/22   Sunnie Nielsen, DO  Biotin 1 MG CAPS Take by mouth.    [provider]  Cholecalciferol 25 MCG (1000 UT) capsule Take 1,000 Units by  mouth daily.    [provider]  clopidogrel (PLAVIX) 75 MG tablet Take 75 mg by mouth daily.    [provider]  fludrocortisone (FLORINEF) 0.1 MG tablet Take 0.05 mg by mouth daily. 10/11/21   [provider]  furosemide (LASIX) 20 MG tablet Take 20 mg by mouth daily. Patient not taking: Reported on 02/13/2022 11/16/21   [provider]  ipratropium (ATROVENT) 0.06 % nasal spray Place 2 sprays into both nostrils 4 (four) times daily. 11/04/22   Shirlee Latch, PA-C  letrozole [facility]) 2.5 MG tablet Take 2.5 mg by mouth daily. 09/16/21   [provider]  levothyroxine (SYNTHROID) 75 MCG tablet Take 75 mcg by mouth daily. 01/30/22   [provider]  lidocaine (XYLOCAINE) 2 % solution Use as directed 15 mLs in the mouth or throat every 3 (three) hours as needed for mouth pain (swish and spit). 11/04/22   Eusebio Friendly B, PA-C  metoprolol succinate (TOPROL-XL) 25 MG 24 hr tablet Take 25 mg by mouth daily.    [provider]  nirmatrelvir & ritonavir (PAXLOVID, 150/100,) 10 x 150 MG & 10 x 100MG  TBPK GFR 55. Take Paxlovid renal dose x 5 days 10/06/22   Shirlee Latch, PA-C  omeprazole (PRILOSEC OTC) 20 MG tablet Prilosec 20 mg capsule,delayed release  Take 1 capsule every day by oral route.    [provider]  rosuvastatin (CRESTOR) 10 MG tablet Take 1 tablet (10 mg total) by mouth daily. 02/13/22   Sunnie Nielsen, DO  Vitamin E 268 MG (400 UNIT) CAPS Take 400 Units by mouth daily.    [provider]    Family History Family History  Problem Relation Age of Onset   CAD Mother    CAD Father     Social History Social History   Tobacco Use   Smoking status: Never   Smokeless tobacco: Never  Vaping Use   Vaping status: Never Used  Substance Use Topics   Alcohol use: No   Drug use: No     Allergies   Avelox [moxifloxacin hcl in nacl], Ciprofloxacin, Biaxin [clarithromycin], Atorvastatin, Codeine, and Sulfa  antibiotics   Review of Systems Review of Systems: negative unless otherwise stated in HPI.      Physical Exam Triage Vital Signs ED Triage Vitals  Encounter Vitals Group     BP 04/23/23 1133 (!) 166/83     Systolic BP Percentile --      Diastolic BP Percentile --      Pulse Rate 04/23/23 1133 70     Resp 04/23/23 1133 18     Temp 04/23/23 1133 98.3 F (36.8 C)     Temp Source 04/23/23 1133 Oral     SpO2 04/23/23 1133 94 %     Weight 04/23/23 1132 125 lb (56.7 kg)     Height --      Head Circumference --      Peak Flow --      Pain Score 04/23/23 1132 5     Pain Loc --      Pain Education --      Exclude from Growth Chart --    No data found.  Updated Vital Signs BP (!) 157/84 (BP Location: Left Arm)   Pulse 70   Temp 98.3 F (36.8 C) (Oral)   Resp 18   Wt 56.7 kg   SpO2 94%   BMI 19.58 kg/m   Visual Acuity Right Eye Distance:   Left Eye Distance:   Bilateral Distance:    Right Eye Near:   Left Eye Near:    Bilateral Near:     Physical Exam GEN:     alert, non-toxic appearing elderly female in no distress    HENT:  mucus membranes moist, dentures present,erythematous patch on soft palate,no tonsillar hypertrophy or exudates, clear nasal discharge, bilateral TM effusions, no erythema but some opaqueness to her TM  EYES:   no scleral injection or discharge RESP:  no increased work of breathing Skin:   warm and dry, no rash on visible skin    UC Treatments / Results  Labs (all labs ordered are listed, but only abnormal results are displayed) Labs Reviewed  SARS CORONAVIRUS 2 BY RT PCR  GROUP A STREP BY PCR    EKG   Radiology No results found.  Procedures Procedures (including critical care time)  Medications Ordered in UC Medications  dexamethasone (DECADRON) injection 10 mg (10 mg Intramuscular Given 04/23/23 1317)    Initial Impression / Assessment and Plan / UC Course  I have reviewed the triage vital signs and the nursing  notes.  Pertinent labs & imaging results that were available during my care of the patient were reviewed by me and considered  in my medical decision making (see chart for details).       Pt is a 88 y.o. female who presents for 7 days of sore throat and new respiratory symptoms along with acute flare of her ear discomfort. Has history of recurrent sinus infections (s/p) sinus surgery, eustachian tube dysfunction. Dyonna is afebrile here without recent antipyretics. Satting well on room air. Overall pt is non-toxic appearing, well hydrated, without respiratory distress. Pulmonary exam  is unremarkable.  COVID and strep testing obtained and were negative.  She has evidence of bilateral effusions with some opaqueness concerning for early otitis media.  Treat with antibiotics as below.  Offered IM versus p.o. steroids the patient chose IM.  Given IM Decadron 10 mg.  Discussed symptomatic treatment.  Typical duration of symptoms discussed. She is to follow up with her ENT care provider.   Return and ED precautions given and voiced understanding. Discussed MDM, treatment plan and plan for follow-up with patient and her husband who agree with plan.     Final Clinical Impressions(s) / UC Diagnoses   Final diagnoses:  Acute dysfunction of Eustachian tube, bilateral  Acute pharyngitis, unspecified etiology     Discharge Instructions      Stop by the pharmacy to pick up your prescriptions.  Follow up with your ear, nose, and throat doctor if symptoms are not improving over the next week.     ED Prescriptions     Medication Sig Dispense Auth. Provider   cephALEXin (KEFLEX) 500 MG capsule Take 1 capsule (500 mg total) by mouth 3 (three) times daily. 21 capsule Katha Cabal, DO      PDMP not reviewed this encounter.   Katha Cabal, DO 04/23/23 1654

## 2023-04-23 NOTE — ED Triage Notes (Addendum)
Sore throat ; Nasal Congestion ; Ear Problem x 1 wk bilateral.

## 2023-11-23 ENCOUNTER — Other Ambulatory Visit: Payer: Self-pay

## 2023-11-23 ENCOUNTER — Emergency Department

## 2023-11-23 ENCOUNTER — Emergency Department
Admission: EM | Admit: 2023-11-23 | Discharge: 2023-11-24 | Disposition: A | Discharge status: Home / Self Care | Source: Home / Self Care | Attending: Emergency Medicine | Admitting: Emergency Medicine

## 2023-11-23 DIAGNOSIS — W01198A Fall on same level from slipping, tripping and stumbling with subsequent striking against other object, initial encounter: Secondary | ICD-10-CM | POA: Insufficient documentation

## 2023-11-23 DIAGNOSIS — S066X0A Traumatic subarachnoid hemorrhage without loss of consciousness, initial encounter: Secondary | ICD-10-CM | POA: Insufficient documentation

## 2023-11-23 DIAGNOSIS — Z7902 Long term (current) use of antithrombotics/antiplatelets: Secondary | ICD-10-CM | POA: Insufficient documentation

## 2023-11-23 DIAGNOSIS — I951 Orthostatic hypotension: Secondary | ICD-10-CM | POA: Diagnosis not present

## 2023-11-23 DIAGNOSIS — S0101XA Laceration without foreign body of scalp, initial encounter: Secondary | ICD-10-CM

## 2023-11-23 DIAGNOSIS — Z7982 Long term (current) use of aspirin: Secondary | ICD-10-CM | POA: Insufficient documentation

## 2023-11-23 DIAGNOSIS — R55 Syncope and collapse: Secondary | ICD-10-CM | POA: Diagnosis not present

## 2023-11-23 LAB — CBC WITH DIFFERENTIAL/PLATELET
Abs Immature Granulocytes: 0.02 10*3/uL (ref 0.00–0.07)
Basophils Absolute: 0.1 10*3/uL (ref 0.0–0.1)
Basophils Relative: 1 %
Eosinophils Absolute: 0.2 10*3/uL (ref 0.0–0.5)
Eosinophils Relative: 2 %
HCT: 34.8 % — ABNORMAL LOW (ref 36.0–46.0)
Hemoglobin: 11.5 g/dL — ABNORMAL LOW (ref 12.0–15.0)
Immature Granulocytes: 0 %
Lymphocytes Relative: 22 %
Lymphs Abs: 1.9 10*3/uL (ref 0.7–4.0)
MCH: 30.8 pg (ref 26.0–34.0)
MCHC: 33 g/dL (ref 30.0–36.0)
MCV: 93.3 fL (ref 80.0–100.0)
Monocytes Absolute: 0.8 10*3/uL (ref 0.1–1.0)
Monocytes Relative: 9 %
Neutro Abs: 5.8 10*3/uL (ref 1.7–7.7)
Neutrophils Relative %: 66 %
Platelets: 241 10*3/uL (ref 150–400)
RBC: 3.73 MIL/uL — ABNORMAL LOW (ref 3.87–5.11)
RDW: 13 % (ref 11.5–15.5)
WBC: 8.8 10*3/uL (ref 4.0–10.5)
nRBC: 0 % (ref 0.0–0.2)

## 2023-11-23 LAB — COMPREHENSIVE METABOLIC PANEL WITH GFR
ALT: 26 U/L (ref 0–44)
AST: 32 U/L (ref 15–41)
Albumin: 3.7 g/dL (ref 3.5–5.0)
Alkaline Phosphatase: 53 U/L (ref 38–126)
Anion gap: 7 (ref 5–15)
BUN: 17 mg/dL (ref 8–23)
CO2: 30 mmol/L (ref 22–32)
Calcium: 9.1 mg/dL (ref 8.9–10.3)
Chloride: 101 mmol/L (ref 98–111)
Creatinine, Ser: 0.98 mg/dL (ref 0.44–1.00)
GFR, Estimated: 56 mL/min — ABNORMAL LOW (ref >60)
Glucose, Bld: 109 mg/dL — ABNORMAL HIGH (ref 70–99)
Potassium: 3.4 mmol/L — ABNORMAL LOW (ref 3.5–5.1)
Sodium: 138 mmol/L (ref 135–145)
Total Bilirubin: 0.6 mg/dL (ref 0.0–1.2)
Total Protein: 6.3 g/dL — ABNORMAL LOW (ref 6.5–8.1)

## 2023-11-23 LAB — PROTIME-INR
INR: 1 (ref 0.8–1.2)
Prothrombin Time: 13.6 s (ref 11.4–15.2)

## 2023-11-23 LAB — APTT: aPTT: 25 s (ref 24–36)

## 2023-11-23 MED ORDER — IOHEXOL 350 MG/ML SOLN
75.0000 mL | Freq: Once | INTRAVENOUS | Status: AC | PRN
Start: 2023-11-23 — End: 2023-11-23
  Administered 2023-11-23: 75 mL via INTRAVENOUS

## 2023-11-23 MED ORDER — LIDOCAINE-EPINEPHRINE (PF) 2 %-1:200000 IJ SOLN
INTRAMUSCULAR | Status: AC
  Filled 2023-11-23: qty 20

## 2023-11-23 NOTE — ED Notes (Signed)
 Surgicel and abd gauze was applied to the lac on the back of pt head per Dr. Rosalia Hammers request d/t lac still bleeding. Bleeding is controlled.

## 2023-11-23 NOTE — ED Triage Notes (Signed)
 Fall, misstep.  Fell backward and hit head on wooden ramp.  No loc.  Takes Plavix and ASA.

## 2023-11-23 NOTE — ED Notes (Signed)
 This RN re-wrapped pt bandaging with AB pad and guaze as it is bleeding through the current guaze.

## 2023-11-23 NOTE — ED Notes (Signed)
 MD Roxan Hockey called and advised pt is + for SAB,

## 2023-11-24 ENCOUNTER — Emergency Department

## 2023-11-24 ENCOUNTER — Inpatient Hospital Stay

## 2023-11-24 ENCOUNTER — Encounter: Payer: Self-pay | Admitting: Emergency Medicine

## 2023-11-24 ENCOUNTER — Observation Stay
Admit: 2023-11-24 | Discharge: 2023-11-24 | Disposition: A | Discharge status: Home / Self Care | Attending: Internal Medicine | Admitting: Internal Medicine

## 2023-11-24 ENCOUNTER — Inpatient Hospital Stay
Admission: EM | Admit: 2023-11-24 | Discharge: 2023-11-27 | DRG: 312 | Disposition: A | Discharge status: Home Health | Attending: Internal Medicine | Admitting: Internal Medicine

## 2023-11-24 DIAGNOSIS — R636 Underweight: Secondary | ICD-10-CM | POA: Diagnosis present

## 2023-11-24 DIAGNOSIS — Z8249 Family history of ischemic heart disease and other diseases of the circulatory system: Secondary | ICD-10-CM

## 2023-11-24 DIAGNOSIS — I609 Nontraumatic subarachnoid hemorrhage, unspecified: Secondary | ICD-10-CM | POA: Diagnosis not present

## 2023-11-24 DIAGNOSIS — Z7952 Long term (current) use of systemic steroids: Secondary | ICD-10-CM

## 2023-11-24 DIAGNOSIS — R55 Syncope and collapse: Principal | ICD-10-CM | POA: Diagnosis present

## 2023-11-24 DIAGNOSIS — I10 Essential (primary) hypertension: Secondary | ICD-10-CM | POA: Diagnosis present

## 2023-11-24 DIAGNOSIS — Z79890 Hormone replacement therapy (postmenopausal): Secondary | ICD-10-CM

## 2023-11-24 DIAGNOSIS — I2510 Atherosclerotic heart disease of native coronary artery without angina pectoris: Secondary | ICD-10-CM | POA: Diagnosis present

## 2023-11-24 DIAGNOSIS — Z7982 Long term (current) use of aspirin: Secondary | ICD-10-CM

## 2023-11-24 DIAGNOSIS — S066X0S Traumatic subarachnoid hemorrhage without loss of consciousness, sequela: Secondary | ICD-10-CM

## 2023-11-24 DIAGNOSIS — J449 Chronic obstructive pulmonary disease, unspecified: Secondary | ICD-10-CM | POA: Diagnosis present

## 2023-11-24 DIAGNOSIS — Z90710 Acquired absence of both cervix and uterus: Secondary | ICD-10-CM

## 2023-11-24 DIAGNOSIS — W010XXA Fall on same level from slipping, tripping and stumbling without subsequent striking against object, initial encounter: Secondary | ICD-10-CM | POA: Diagnosis present

## 2023-11-24 DIAGNOSIS — Z79811 Long term (current) use of aromatase inhibitors: Secondary | ICD-10-CM

## 2023-11-24 DIAGNOSIS — S066X1A Traumatic subarachnoid hemorrhage with loss of consciousness of 30 minutes or less, initial encounter: Secondary | ICD-10-CM | POA: Diagnosis present

## 2023-11-24 DIAGNOSIS — Z853 Personal history of malignant neoplasm of breast: Secondary | ICD-10-CM

## 2023-11-24 DIAGNOSIS — Z7902 Long term (current) use of antithrombotics/antiplatelets: Secondary | ICD-10-CM

## 2023-11-24 DIAGNOSIS — I951 Orthostatic hypotension: Principal | ICD-10-CM | POA: Diagnosis present

## 2023-11-24 DIAGNOSIS — Z681 Body mass index (BMI) 19 or less, adult: Secondary | ICD-10-CM

## 2023-11-24 DIAGNOSIS — S0101XA Laceration without foreign body of scalp, initial encounter: Secondary | ICD-10-CM | POA: Diagnosis present

## 2023-11-24 DIAGNOSIS — R296 Repeated falls: Secondary | ICD-10-CM | POA: Diagnosis present

## 2023-11-24 DIAGNOSIS — E785 Hyperlipidemia, unspecified: Secondary | ICD-10-CM | POA: Diagnosis present

## 2023-11-24 DIAGNOSIS — Z79899 Other long term (current) drug therapy: Secondary | ICD-10-CM

## 2023-11-24 LAB — ECHOCARDIOGRAM COMPLETE
AR max vel: 2.34 cm2
AV Peak grad: 5.2 mmHg
Ao pk vel: 1.14 m/s
Area-P 1/2: 3.63 cm2
Height: 67 in
S' Lateral: 2.1 cm
Weight: 1767.21 [oz_av]

## 2023-11-24 LAB — TROPONIN I (HIGH SENSITIVITY): Troponin I (High Sensitivity): 5 ng/L (ref <18)

## 2023-11-24 LAB — GLUCOSE, CAPILLARY: Glucose-Capillary: 83 mg/dL (ref 70–99)

## 2023-11-24 LAB — MRSA NEXT GEN BY PCR, NASAL: MRSA by PCR Next Gen: NOT DETECTED

## 2023-11-24 MED ORDER — POTASSIUM CHLORIDE 20 MEQ PO PACK
40.0000 meq | PACK | Freq: Once | ORAL | Status: AC
  Administered 2023-11-24: 40 meq via ORAL
  Filled 2023-11-24: qty 2

## 2023-11-24 MED ORDER — NIMODIPINE 30 MG PO CAPS
60.0000 mg | ORAL_CAPSULE | ORAL | Status: DC
  Filled 2023-11-24 (×9): qty 2

## 2023-11-24 MED ORDER — FLUDROCORTISONE ACETATE 0.1 MG PO TABS
0.0500 mg | ORAL_TABLET | Freq: Every day | ORAL | Status: DC
  Administered 2023-11-25: 0.05 mg via ORAL
  Filled 2023-11-24: qty 0.5
  Filled 2023-11-24: qty 1

## 2023-11-24 MED ORDER — SODIUM CHLORIDE 0.9 % IV BOLUS
500.0000 mL | Freq: Once | INTRAVENOUS | Status: AC
  Administered 2023-11-24: 500 mL via INTRAVENOUS

## 2023-11-24 MED ORDER — METOPROLOL SUCCINATE ER 50 MG PO TB24: 25.0000 mg | ORAL_TABLET | Freq: Every day | ORAL | Status: DC

## 2023-11-24 MED ORDER — LEVOTHYROXINE SODIUM 50 MCG PO TABS
75.0000 ug | ORAL_TABLET | Freq: Every day | ORAL | Status: DC
  Administered 2023-11-25 – 2023-11-27 (×3): 75 ug via ORAL
  Filled 2023-11-24: qty 2
  Filled 2023-11-24: qty 1
  Filled 2023-11-24: qty 2

## 2023-11-24 MED ORDER — METOPROLOL SUCCINATE ER 25 MG PO TB24
25.0000 mg | ORAL_TABLET | Freq: Two times a day (BID) | ORAL | Status: DC
  Administered 2023-11-24 – 2023-11-27 (×5): 25 mg via ORAL
  Filled 2023-11-24 (×6): qty 1

## 2023-11-24 MED ORDER — CHLORHEXIDINE GLUCONATE CLOTH 2 % EX PADS
6.0000 | MEDICATED_PAD | Freq: Every day | CUTANEOUS | Status: DC
  Administered 2023-11-24 – 2023-11-26 (×3): 6 via TOPICAL

## 2023-11-24 MED ORDER — OMEPRAZOLE MAGNESIUM 20 MG PO TBEC: 20.0000 mg | DELAYED_RELEASE_TABLET | ORAL | Status: DC

## 2023-11-24 MED ORDER — LIDOCAINE VISCOUS HCL 2 % MT SOLN: 15.0000 mL | OROMUCOSAL | Status: DC | PRN

## 2023-11-24 MED ORDER — ONDANSETRON HCL 4 MG PO TABS: 4.0000 mg | ORAL_TABLET | Freq: Four times a day (QID) | ORAL | Status: DC | PRN

## 2023-11-24 MED ORDER — POTASSIUM CHLORIDE CRYS ER 20 MEQ PO TBCR
40.0000 meq | EXTENDED_RELEASE_TABLET | Freq: Once | ORAL | Status: DC
  Filled 2023-11-24: qty 2

## 2023-11-24 MED ORDER — PANTOPRAZOLE SODIUM 40 MG PO TBEC
40.0000 mg | DELAYED_RELEASE_TABLET | Freq: Every day | ORAL | Status: DC
  Administered 2023-11-25 – 2023-11-27 (×3): 40 mg via ORAL
  Filled 2023-11-24 (×3): qty 1

## 2023-11-24 MED ORDER — ROSUVASTATIN CALCIUM 10 MG PO TABS
10.0000 mg | ORAL_TABLET | Freq: Every day | ORAL | Status: DC
  Administered 2023-11-25 – 2023-11-27 (×3): 10 mg via ORAL
  Filled 2023-11-24 (×3): qty 1

## 2023-11-24 MED ORDER — ONDANSETRON HCL 4 MG/2ML IJ SOLN: 4.0000 mg | Freq: Four times a day (QID) | INTRAMUSCULAR | Status: DC | PRN

## 2023-11-24 MED ORDER — ORAL CARE MOUTH RINSE: 15.0000 mL | OROMUCOSAL | Status: DC | PRN

## 2023-11-24 MED ORDER — LEVOTHYROXINE SODIUM 50 MCG PO TABS: 75.0000 ug | ORAL_TABLET | Freq: Every day | ORAL | Status: DC

## 2023-11-24 MED ORDER — LETROZOLE 2.5 MG PO TABS
2.5000 mg | ORAL_TABLET | Freq: Every day | ORAL | Status: DC
  Administered 2023-11-25 – 2023-11-27 (×2): 2.5 mg via ORAL
  Filled 2023-11-24 (×3): qty 1

## 2023-11-24 MED ORDER — LACTATED RINGERS IV BOLUS
1000.0000 mL | Freq: Once | INTRAVENOUS | Status: AC
  Administered 2023-11-24: 1000 mL via INTRAVENOUS

## 2023-11-24 MED ORDER — MIDODRINE HCL 5 MG PO TABS: 2.5000 mg | ORAL_TABLET | Freq: Three times a day (TID) | ORAL | Status: DC

## 2023-11-24 MED ORDER — IPRATROPIUM BROMIDE 0.06 % NA SOLN: 2.0000 | Freq: Four times a day (QID) | NASAL | Status: DC

## 2023-11-24 MED ORDER — ACETAMINOPHEN 325 MG PO TABS
650.0000 mg | ORAL_TABLET | Freq: Four times a day (QID) | ORAL | Status: DC | PRN
Start: 2023-11-24 — End: 2023-11-27
  Administered 2023-11-24 (×2): 650 mg via ORAL
  Filled 2023-11-24 (×2): qty 2

## 2023-11-24 MED ORDER — AMITRIPTYLINE HCL 25 MG PO TABS
25.0000 mg | ORAL_TABLET | Freq: Every day | ORAL | Status: DC
  Administered 2023-11-24 – 2023-11-26 (×3): 25 mg via ORAL
  Filled 2023-11-24 (×3): qty 1

## 2023-11-24 NOTE — Care Management CC44 (Signed)
 Condition Code 44 Documentation Completed  Patient Details  Name: Judy Jenkins MRN: [id number] Date of Birth: 1935/04/08   Condition Code 44 given:  Yes Patient signature on Condition Code 44 notice:  Yes Documentation of 2 MD's agreement:  Yes Code 44 added to claim:  Yes    Kizzie Ide Naiya Corral, RN 11/24/2023, 2:31 PM

## 2023-11-24 NOTE — ED Provider Notes (Signed)
 [facility] Provider Note    Event Date/Time   First MD Initiated Contact with Patient 11/24/23 [phone]     (approximate)   History   No chief complaint on file.   HPI Judy Jenkins is a 89 y.o. female  who notably reports a history of orthostatic hypotension with prior episodes of "passing out when I stand up too fast".  She presents by EMS after passing out or nearly passing out several times.  I just discharged her from the ED within the last 1-2 hours see my separate note.  She was seen by another provider after falling and hitting the back of her head which resulted in a small subarachnoid hemorrhage.  She takes Eliquis and Plavix.  She had a reassuring CTA head and neck and a repeat noncontrast head CT at approximately 2:30 AM as recommended by the neurosurgeon.  There was no significant interval change and the patient was discharged feeling well.  However, she states that she feels just generally weak all over and when she tried to stand up to walk she passed out or nearly passed out and had to be helped to the ground.  She tried to get back up again and passed out again.  This happened at least a couple of times.  EMS was called.  The patient is awake and alert.  She has no numbness, tingling, nor weakness in any particular extremity.  She does not report any pain, a little bit of residual ache in the back of her head where she has some sutures, otherwise no acute pain.  She said that she just feels so weak in general, like she cannot support herself     Physical Exam   ED Triage Vitals  Encounter Vitals Group     BP 11/24/23 0514 121/62     Systolic BP Percentile --      Diastolic BP Percentile --      Pulse Rate 11/24/23 0514 71     Resp 11/24/23 0514 (!) 23     Temp 11/24/23 0514 97.7 F (36.5 C)     Temp Source 11/24/23 0514 Oral     SpO2 11/24/23 0514 100 %     Weight 11/24/23 0513 54.4 kg (120 lb)     Height 11/24/23 0513 1.702 m (5\' 7" )      Head Circumference --      Peak Flow --      Pain Score 11/24/23 0512 0     Pain Loc --      Pain Education --      Exclude from Growth Chart --      Most recent vital signs: Vitals:   11/24/23 0514  BP: 121/62  Pulse: 71  Resp: (!) 23  Temp: 97.7 F (36.5 C)  SpO2: 100%    General: Awake, alert, conversant, generally well-appearing.  Pupils are equal and reactive. CV:  Good peripheral perfusion.  Regular rate and rhythm, normal heart sounds. Resp:  Normal effort. Speaking easily and comfortably, no accessory muscle usage nor intercostal retractions.  Lungs are clear to auscultation bilaterally. Abd:  No distention.  No tenderness to palpation of the abdomen. Other:  No focal neurological deficits appreciated.   ED Results / Procedures / Treatments   Labs (all labs ordered are listed, but only abnormal results are displayed) Labs Reviewed  TROPONIN I (HIGH SENSITIVITY)     EKG  ED ECG REPORT I, Loleta Rose, the attending physician, personally viewed  and interpreted this ECG.  Date: 11/24/2023 EKG Time: 5:17 AM Rate: 72 Rhythm: normal sinus rhythm QRS Axis: normal Intervals: normal ST/T Wave abnormalities: Non-specific ST segment / T-wave changes, but no clear evidence of acute ischemia. Narrative Interpretation: no definitive evidence of acute ischemia; does not meet STEMI criteria.    RADIOLOGY No indication to repeat head CT at this time   PROCEDURES:  Critical Care performed: No  .1-3 Lead EKG Interpretation  Performed by: Loleta Rose, MD Authorized by: Loleta Rose, MD     Interpretation: normal     ECG rate:  70   ECG rate assessment: normal     Rhythm: sinus rhythm     Ectopy: none     Conduction: normal       IMPRESSION / MDM / ASSESSMENT AND PLAN / ED COURSE  I reviewed the triage vital signs and the nursing notes.                              Differential diagnosis includes, but is not limited to, orthostatic syncope,  vasovagal episode, worsening intracranial hemorrhage/subarachnoid, ACS.  Patient's presentation is most consistent with acute presentation with potential threat to life or bodily function.  Labs/studies ordered: High-sensitivity troponin, EKG  Interventions/Medications given:  Medications  sodium chloride 0.9 % bolus 500 mL (500 mLs Intravenous Bolus from Bag 11/24/23 0527)    (Note:  hospital course my include additional interventions and/or labs/studies not listed above.)   Vital signs are stable and within normal limits.  Patient is frustrated but not in acute distress.  She says she just feels so wiped out that she feels like she cannot walk.  She tried to get back up again after the initial fall and either passed out or nearly passed out a couple more times.  This sounds very consistent with her history of orthostatic episodes; she was observed in the emergency department for more than 8 hours with very little to eat or drink.  She received an IV fluid bolus of 1 L while she was in the ED to help clear her kidneys of the IV contrast, but otherwise had minimal p.o. intake, and then she was discharged in the early morning hours.  Given her history and age and acute symptoms, I think is very reasonable to observe her in the hospital.  I will check an EKG and high-sensitivity troponin which was not checked previously since she was having no chest pain, but I think is reasonable to do that as a screening test this time.  Given no worsening headache or neurological symptoms, I do not think it is necessary to repeat a head CT currently as it has been only about 3 hours since her last head CT.  It will be up to the hospitalist to determine if they wish to repeat another scan.  I will plan to consult the hospitalist team for admission after the troponin is back.  The patient is on the cardiac monitor to evaluate for evidence of arrhythmia and/or significant heart rate changes.   Clinical Course as  of 11/24/23 4696  Sat Nov 24, 2023  0612 High-sensitivity troponin is within normal limits which is reassuring.  EKG shows no evidence of ischemia.  Consulting hospitalist for admission. [CF]  2952 Consulted with Dr. Arville Care including my plan for the repeat head CT at 8:30 AM.  He understands and agrees and will put in admission orders and his daytime  colleague will likely do the evaluation and admission itself [CF]    Clinical Course User Index [CF] Loleta Rose, MD     FINAL CLINICAL IMPRESSION(S) / ED DIAGNOSES   Final diagnoses:  Syncope and collapse  Subarachnoid hemorrhage following injury, no loss of consciousness, sequela (HCC)     Rx / DC Orders   ED Discharge Orders     None        Note:  This document was prepared using Dragon voice recognition software and may include unintentional dictation errors.   Loleta Rose, MD 11/24/23 [phone]

## 2023-11-24 NOTE — Discharge Instructions (Addendum)
 As we discussed, there was no change over time on your CT scans, so fortunately you are able to go home despite having a small subarachnoid hemorrhage from your fall.  We recommend you call the number provided to schedule follow-up appointment with Dr. Emogene Morgan or one of his neurosurgery colleagues.  Additionally, you should follow-up with your primary care provider in about 1 week to have the sutures removed from the back of your head.  It is okay to wash your hair gently.  There may be some oozing, but there should not be a significant amount of bleeding at this point.  You can use cold packs on your head wound to help with any swelling and pain.  You can also take over-the-counter Tylenol 1000 mg up to 4 times a day (no more often than every 6 hours).    Return to the emergency department if you develop new or worsening symptoms that concern you.

## 2023-11-24 NOTE — Progress Notes (Signed)
  Echocardiogram 2D Echocardiogram has been performed.  Judy Jenkins 11/24/2023, 2:21 PM

## 2023-11-24 NOTE — Plan of Care (Signed)
  Problem: Education: Goal: Knowledge of General Education information will improve Description: Including pain rating scale, medication(s)/side effects and non-pharmacologic comfort measures Outcome: Progressing   Problem: Health Behavior/Discharge Planning: Goal: Ability to manage health-related needs will improve Outcome: Progressing   Problem: Clinical Measurements: Goal: Ability to maintain clinical measurements within normal limits will improve Outcome: Progressing Goal: Will remain free from infection Outcome: Progressing Goal: Diagnostic test results will improve Outcome: Progressing Goal: Respiratory complications will improve Outcome: Progressing Goal: Cardiovascular complication will be avoided Outcome: Progressing   Problem: Activity: Goal: Risk for activity intolerance will decrease Outcome: Progressing   Problem: Elimination: Goal: Will not experience complications related to bowel motility Outcome: Progressing   Problem: Safety: Goal: Ability to remain free from injury will improve Outcome: Progressing   Problem: Skin Integrity: Goal: Risk for impaired skin integrity will decrease Outcome: Progressing   

## 2023-11-24 NOTE — Progress Notes (Addendum)
 Patient unable to take large size pills without choking. RN sent message to pharmacy to inquire regarding alternatives.  MD notified by RN via secure chat. Medication routes changed and other discontinued by MD.

## 2023-11-24 NOTE — ED Provider Notes (Addendum)
 [facility] Provider Note    Event Date/Time   First MD Initiated Contact with Patient 11/23/23 1938     (approximate)   History   Fall   HPI  Judy Jenkins is a 89 year old female presenting to the ER for evaluation after a fall.  Patient was walking when she lost her balance and fell backward and hit her head on a ramp.  No LOC.  Takes Plavix and aspirin.  Denies injury to other areas.  Denies chest pain, shortness of breath.      Physical Exam   Triage Vital Signs: ED Triage Vitals  Encounter Vitals Group     BP 11/23/23 1834 137/64     Systolic BP Percentile --      Diastolic BP Percentile --      Pulse Rate 11/23/23 1834 80     Resp 11/23/23 1834 16     Temp 11/23/23 1834 98.7 F (37.1 C)     Temp Source 11/23/23 1834 Oral     SpO2 11/23/23 1834 98 %     Weight 11/23/23 1835 125 lb (56.7 kg)     Height --      Head Circumference --      Peak Flow --      Pain Score 11/23/23 1835 0     Pain Loc --      Pain Education --      Exclude from Growth Chart --     Most recent vital signs: Vitals:   11/23/23 2323 11/23/23 2333  BP: (!) 191/83 (!) 179/84  Pulse: 88 91  Resp: (!) 21 16  Temp:    SpO2: 99% 99%   Nursing notes and vital signs reviewed.  General: Adult female, laying bed, awake interactive Head: Dried blood over the posterior scalp with 4 cm vertical laceration. Chest: Symmetric chest rise, no tenderness to palpation.  Cardiac: Regular rhythm and rate.  Respiratory: Lungs clear to auscultation Abdomen: Soft, nondistended. No tenderness to palpation.  Pelvis: Stable in AP and lateral compression. No tenderness to palpation. MSK: No deformity to bilateral upper and lower extremity. Full range of motion to bilateral upper lower extremity with no pain. Neuro: Alert and oriented, normal extraocular movements, symmetric facial movement, sensation intact over bilateral upper and lower extremities with 5 out of 5 strength.   Normal finger-to-nose testing. Skin: No evidence of burns  ED Results / Procedures / Treatments   Labs (all labs ordered are listed, but only abnormal results are displayed) Labs Reviewed  CBC WITH DIFFERENTIAL/PLATELET - Abnormal; Notable for the following components:      Result Value   RBC 3.73 (*)    Hemoglobin 11.5 (*)    HCT 34.8 (*)    All other components within normal limits  COMPREHENSIVE METABOLIC PANEL WITH GFR - Abnormal; Notable for the following components:   Potassium 3.4 (*)    Glucose, Bld 109 (*)    Total Protein 6.3 (*)    GFR, Estimated 56 (*)    All other components within normal limits  PROTIME-INR  APTT  CBC WITH DIFFERENTIAL/PLATELET     EKG EKG independently reviewed interpreted by myself (ER attending) demonstrates:    RADIOLOGY Imaging independently reviewed and interpreted by myself demonstrates:  CT head demonstrates small subarachnoid hemorrhage within the right sylvian fissure, scalp laceration without underlying fracture CT angio without aneurysm CT C-spine without acute fracture Repeat CT head pending  PROCEDURES:  Critical Care performed: No  .Laceration  Repair  Date/Time: 11/24/2023 12:32 AM  Performed by: Trinna Post, MD Authorized by: Trinna Post, MD   Consent:    Consent obtained:  Verbal   Consent given by:  Patient   Risks, benefits, and alternatives were discussed: yes   Laceration details:    Location:  Scalp   Scalp location:  Occipital   Length (cm):  4 Treatment:    Irrigation solution:  Sterile saline Skin repair:    Repair method:  Sutures   Suture size:  4-0   Suture material:  Prolene   Suture technique:  Simple interrupted   Number of sutures:  6 Repair type:    Repair type:  Simple Post-procedure details:    Dressing:  Open (no dressing)   Procedure completion:  Tolerated    MEDICATIONS ORDERED IN ED: Medications  lidocaine-EPINEPHrine (XYLOCAINE W/EPI) 2 %-1:200000 (PF) injection (  Given 11/23/23  2030)  iohexol (OMNIPAQUE) 350 MG/ML injection 75 mL (75 mLs Intravenous Contrast Given 11/23/23 2240)  lactated ringers bolus 1,000 mL (1,000 mLs Intravenous New Bag/Given 11/24/23 0021)     IMPRESSION / MDM / ASSESSMENT AND PLAN / ED COURSE  I reviewed the triage vital signs and the nursing notes.  Differential diagnosis includes, but is not limited to, intracranial bleed, skull fracture, spine fracture, no evidence of thoracoabdominal trauma  Patient's presentation is most consistent with acute presentation with potential threat to life or bodily function.  89 year old female presenting after a ground-level fall.  Stable vitals on presentation.  Labs without critical derangement, mild anemia noted.  CT head and C-spine ordered from triage.  CT head did demonstrate a small area concerning for subarachnoid hemorrhage.  Case was reviewed with Dr. Emogene Morgan with neurosurgery.  He did recommend a CT angio to evaluate for aneurysm.  If this was without evidence of aneurysm, recommended a repeat head CT in about 6 hours to ensure that bleed was not progressing. Laceration repaired as above. If repeat head CT was stable and patient remained neurologically intact, did feel that she was stable for discharge with outpatient follow-up.  Signed out to oncoming physician at 1220 pending repeat CT and disposition.      FINAL CLINICAL IMPRESSION(S) / ED DIAGNOSES   Final diagnoses:  Subarachnoid hematoma, without loss of consciousness, initial encounter (HCC)     Rx / DC Orders   ED Discharge Orders     None        Note:  This document was prepared using Dragon voice recognition software and may include unintentional dictation errors.   Trinna Post, MD 11/24/23 Guy Franco, MD 11/24/23 [phone]

## 2023-11-24 NOTE — ED Triage Notes (Signed)
 Pt arrived via EMS from home for multiple syncopal episodes. Pt was just seen here in the ED an hour ago. Pt states she feels weak and tired.

## 2023-11-24 NOTE — ED Provider Notes (Signed)
-----------------------------------------   12:13 AM on 11/24/2023 -----------------------------------------  Assuming care from Dr. Rosalia Hammers.  In short, Judy Jenkins is a 89 y.o. female with a chief complaint of fall with small head bleed.  Refer to the original H&P for additional details.  The current plan of care is to follow up repeat CT head to determine if head bleed has worsened.   Clinical Course as of 11/24/23 2956  Sat Nov 24, 2023  0310 CT Head Wo Contrast I viewed and interpreted the patient's head CT and I see no change.  Radiology confirmed no clear change in the size of the subarachnoid, even given the complication of the IV contrast patient received in the interim.  I provided reassurance to the patient and her family member and they are both comfortable with plan for discharge.  She is comfortable and in no distress at this time.  I went over the discharge instructions and gave my usual and customary return precautions. [CF]    Clinical Course User Index [CF] Loleta Rose, MD     Medications  lidocaine-EPINEPHrine (XYLOCAINE W/EPI) 2 %-1:200000 (PF) injection (  Given 11/23/23 2030)  iohexol (OMNIPAQUE) 350 MG/ML injection 75 mL (75 mLs Intravenous Contrast Given 11/23/23 2240)  lactated ringers bolus 1,000 mL (0 mLs Intravenous Stopped 11/24/23 0207)     ED Discharge Orders     None      Final diagnoses:  Subarachnoid hematoma, without loss of consciousness, initial encounter (HCC)  Scalp laceration, initial encounter     Loleta Rose, MD 11/24/23 [phone]

## 2023-11-24 NOTE — H&P (Addendum)
 History and Physical    Monie Shere WGN:[id number] DOB: September 21, 1934 DOA: 11/24/2023  PCP: [facility] (Confirm with patient/family/NH records and if not entered, this has to be entered at [facility] point of entry) Patient coming from: Home   Chief Complaint: I fell and hit my head  HPI: Lynnelle Mesmer is a 89 y.o. female with medical history significant of orthostatic hypotension on fludrocortisone, breast cancer on hormone therapy, HTN, HLD, presented with recurrent falls and intracranial bleed.  Patient has a history of orthostatic hypotension, despite on fludrocortisone and compression stocking, symptoms are persistent.  For example, she mentioned that yesterday she checked her blood pressure while lying down SBP 170/60 however when she sits up and check her blood pressure again SBP was 70.  She has had the problem for at least 3 to 4 years and she learned to gradual and slow body position changes from lying down to sitting up and standing up.  However yesterday, she fell after she is standing up which she attributed " might have got up too fast" and hit her head on the back and loss of consciousness briefly.  ED workup showed small 5 mm SAH in the sylvian fissure.  And the patient was reassured and sent home from ED however while in the parking lot patient fell again when trying to enter a car.  And she had to double back to ER for further evaluation.  Neurosurgery recommended repeat CT this morning.  Currently patient has no complaints, denied any lightheadedness headache blurry vision nauseous vomiting.  No numbness weakness of any other limbs.  Denied any vertigo.   Review of Systems: As per HPI otherwise 14 point review of systems negative.    Past Medical History:  Diagnosis Date   Cancer (HCC)    Tachycardia     Past Surgical History:  Procedure Laterality Date   ABDOMINAL HYSTERECTOMY     BREAST SURGERY     CHOLECYSTECTOMY       reports that she has never  smoked. She has never used smokeless tobacco. She reports that she does not drink alcohol and does not use drugs.  Allergies  Allergen Reactions   Avelox [Moxifloxacin Hcl In Nacl] Hives and Nausea And Vomiting   Ciprofloxacin Hives   Biaxin [Clarithromycin] Hives and Nausea And Vomiting   Atorvastatin Other (See Comments)    Myalgia    Codeine Other (See Comments)    Headaches   Sulfa Antibiotics Rash    Family History  Problem Relation Age of Onset   CAD Mother    CAD Father      Prior to Admission medications   Medication Sig Start Date End Date Taking? Authorizing Provider  amitriptyline (ELAVIL) 10 MG tablet Take 10 mg by mouth at bedtime.    [provider]  aspirin EC 81 MG tablet Take 1 tablet (81 mg total) by mouth daily. 02/14/22   Sunnie Nielsen, DO  Biotin 1 MG CAPS Take by mouth.    [provider]  cephALEXin (KEFLEX) 500 MG capsule Take 1 capsule (500 mg total) by mouth 3 (three) times daily. 04/23/23   Katha Cabal, DO  Cholecalciferol 25 MCG (1000 UT) capsule Take 1,000 Units by mouth daily.    [provider]  clopidogrel (PLAVIX) 75 MG tablet Take 75 mg by mouth daily.    [provider]  fludrocortisone (FLORINEF) 0.1 MG tablet Take 0.05 mg by mouth daily. 10/11/21   [provider]  furosemide (LASIX) 20 MG tablet Take 20 mg by mouth daily. Patient not taking: Reported on 02/13/2022 11/16/21   [provider]  ipratropium (ATROVENT) 0.06 % nasal spray Place 2 sprays into both nostrils 4 (four) times daily. 11/04/22   Shirlee Latch, PA-C  letrozole [facility]) 2.5 MG tablet Take 2.5 mg by mouth daily. 09/16/21   [provider]  levothyroxine (SYNTHROID) 75 MCG tablet Take 75 mcg by mouth daily. 01/30/22   [provider]  lidocaine (XYLOCAINE) 2 % solution Use as directed 15 mLs in the mouth or throat every 3 (three) hours as needed for mouth pain (swish and spit). 11/04/22   Eusebio Friendly B,  PA-C  metoprolol succinate (TOPROL-XL) 25 MG 24 hr tablet Take 25 mg by mouth daily.    [provider]  nirmatrelvir & ritonavir (PAXLOVID, 150/100,) 10 x 150 MG & 10 x 100MG  TBPK GFR 55. Take Paxlovid renal dose x 5 days 10/06/22   Eusebio Friendly B, PA-C  omeprazole (PRILOSEC OTC) 20 MG tablet Prilosec 20 mg capsule,delayed release  Take 1 capsule every day by oral route.    [provider]  rosuvastatin (CRESTOR) 10 MG tablet Take 1 tablet (10 mg total) by mouth daily. 02/13/22   Sunnie Nielsen, DO  Vitamin E 268 MG (400 UNIT) CAPS Take 400 Units by mouth daily.    [provider]    Physical Exam: Vitals:   11/24/23 0513 11/24/23 0514 11/24/23 0715 11/24/23 0802  BP:  121/62 135/70 (!) 181/74  Pulse:  71 71   Resp:  (!) 23 13 20   Temp:  97.7 F (36.5 C)  97.8 F (36.6 C)  TempSrc:  Oral  Oral  SpO2:  100% 100% 99%  Weight: 54.4 kg   50.1 kg  Height: 5\' 7"  (1.702 m)       Constitutional: NAD, calm, comfortable Vitals:   11/24/23 0513 11/24/23 0514 11/24/23 0715 11/24/23 0802  BP:  121/62 135/70 (!) 181/74  Pulse:  71 71   Resp:  (!) 23 13 20   Temp:  97.7 F (36.5 C)  97.8 F (36.6 C)  TempSrc:  Oral  Oral  SpO2:  100% 100% 99%  Weight: 54.4 kg   50.1 kg  Height: 5\' 7"  (1.702 m)      Eyes: PERRL, lids and conjunctivae normal ENMT: Mucous membranes are moist. Posterior pharynx clear of any exudate or lesions.Normal dentition.  Neck: normal, supple, no masses, no thyromegaly Respiratory: clear to auscultation bilaterally, no wheezing, no crackles. Normal respiratory effort. No accessory muscle use.  Cardiovascular: Regular rate and rhythm, no murmurs / rubs / gallops. No extremity edema. 2+ pedal pulses. No carotid bruits.  Abdomen: no tenderness, no masses palpated. No hepatosplenomegaly. Bowel sounds positive.  Musculoskeletal: no clubbing / cyanosis. No joint deformity upper and lower extremities. Good ROM, no contractures. Normal muscle  tone.  Skin: no rashes, lesions, ulcers. No induration Neurologic: CN 2-12 grossly intact. Sensation intact, DTR normal. Strength 5/5 in all 4.  Psychiatric: Normal judgment and insight. Alert and oriented x 3. Normal mood.     Labs on Admission: I have personally reviewed following labs and imaging studies  CBC: Recent Labs  Lab 11/23/23 2138  WBC 8.8  NEUTROABS 5.8  HGB 11.5*  HCT 34.8*  MCV 93.3  PLT 241   Basic Metabolic Panel: Recent Labs  Lab 11/23/23 2138  NA 138  K 3.4*  CL 101  CO2 30  GLUCOSE 109*  BUN  17  CREATININE 0.98  CALCIUM 9.1   GFR: Estimated Creatinine Clearance: 32 mL/min (by C-G formula based on SCr of 0.98 mg/dL). Liver Function Tests: Recent Labs  Lab 11/23/23 2138  AST 32  ALT 26  ALKPHOS 53  BILITOT 0.6  PROT 6.3*  ALBUMIN 3.7   No results for input(s): "LIPASE", "AMYLASE" in the last 168 hours. No results for input(s): "AMMONIA" in the last 168 hours. Coagulation Profile: Recent Labs  Lab 11/23/23 2138  INR 1.0   Cardiac Enzymes: No results for input(s): "CKTOTAL", "CKMB", "CKMBINDEX", "TROPONINI" in the last 168 hours. BNP (last 3 results) No results for input(s): "PROBNP" in the last 8760 hours. HbA1C: No results for input(s): "HGBA1C" in the last 72 hours. CBG: Recent Labs  Lab 11/24/23 0807  GLUCAP 83   Lipid Profile: No results for input(s): "CHOL", "HDL", "LDLCALC", "TRIG", "CHOLHDL", "LDLDIRECT" in the last 72 hours. Thyroid Function Tests: No results for input(s): "TSH", "T4TOTAL", "FREET4", "T3FREE", "THYROIDAB" in the last 72 hours. Anemia Panel: No results for input(s): "VITAMINB12", "FOLATE", "FERRITIN", "TIBC", "IRON", "RETICCTPCT" in the last 72 hours. Urine analysis:    Component Value Date/Time   COLORURINE STRAW (A) 02/12/2022 1749   APPEARANCEUR CLEAR (A) 02/12/2022 1749   LABSPEC 1.010 02/12/2022 1749   PHURINE 8.0 02/12/2022 1749   GLUCOSEU NEGATIVE 02/12/2022 1749   HGBUR NEGATIVE  02/12/2022 1749   BILIRUBINUR NEGATIVE 02/12/2022 1749   KETONESUR 5 (A) 02/12/2022 1749   PROTEINUR NEGATIVE 02/12/2022 1749   NITRITE NEGATIVE 02/12/2022 1749   LEUKOCYTESUR NEGATIVE 02/12/2022 1749    Radiological Exams on Admission: CT Head Wo Contrast Result Date: 11/24/2023 CLINICAL DATA:  89 year old female status post fall backwards, small volume subarachnoid hemorrhage. EXAM: CT HEAD WITHOUT CONTRAST TECHNIQUE: Contiguous axial images were obtained from the base of the skull through the vertex without intravenous contrast. RADIATION DOSE REDUCTION: This exam was performed according to the departmental dose-optimization program which includes automated exposure control, adjustment of the mA and/or kV according to patient size and/or use of iterative reconstruction technique. COMPARISON:  Head CT 0229 hours today and earlier. FINDINGS: Brain: Trace suspected subarachnoid hemorrhage within the sulcus at the right inferior frontal gyrus series 2, image 14 appears unchanged since presentation yesterday. No IVH or ventriculomegaly. Cavum septum lucent, normal variant. No intracranial mass effect or midline shift. Stable gray-white matter differentiation throughout the brain. No new intracranial hemorrhage identified. Vascular: No suspicious intracranial vascular hyperdensity. Calcified atherosclerosis at the skull base. Skull: Osteopenia.  Stable. Sinuses/Orbits: Low-density fluid or mucosal thickening in the left maxillary sinus appears inflammatory, with previous sinus surgery on that side. Stable sinus aeration. Tympanic cavities and mastoids well aerated. Other: Broad-based posterior scalp hematoma along the left convexity has progressed since yesterday. Evidence of associated skin laceration. Underlying calvarium there appears stable and intact. Negative orbits soft tissues. IMPRESSION: 1. Trace SAH at the right frontal gyrus stable since yesterday. No new intracranial abnormality. 2. Progressed  Left posterior scalp hematoma, no skull fracture identified. Electronically Signed   By: Odessa Fleming M.D.   On: 11/24/2023 09:47   CT Head Wo Contrast Result Date: 11/24/2023 CLINICAL DATA:  Status post trauma, follow-up subarachnoid hemorrhage. EXAM: CT HEAD WITHOUT CONTRAST TECHNIQUE: Contiguous axial images were obtained from the base of the skull through the vertex without intravenous contrast. RADIATION DOSE REDUCTION: This exam was performed according to the departmental dose-optimization program which includes automated exposure control, adjustment of the mA and/or kV according to patient size and/or use of  iterative reconstruction technique. COMPARISON:  November 23, 2023 FINDINGS: Brain: There is generalized cerebral atrophy with widening of the extra-axial spaces and ventricular dilatation. There are areas of decreased attenuation within the white matter tracts of the supratentorial brain, consistent with microvascular disease changes. A predominantly stable 5 mm hyperdense area is seen within the anterior aspect of the sylvian fissure on the right. This is seen on the prior study. There is no evidence of associated mass effect or midline shift. Cavum septum pellucidum is noted. Vascular: No hyperdense vessel or unexpected calcification. Skull: Normal. Negative for fracture or focal lesion. Sinuses/Orbits: No acute finding. Other: Right posterior parietal scalp soft tissue swelling is noted. IMPRESSION: 1. Predominantly stable 5 mm hyperdense area within the anterior aspect of the Sylvian fissure on the right, consistent with a small amount of subarachnoid hemorrhage. 2. Generalized cerebral atrophy and microvascular disease changes of the supratentorial brain. 3. Right posterior parietal scalp soft tissue swelling. Electronically Signed   By: Aram Candela M.D.   On: 11/24/2023 02:46   CT ANGIO HEAD NECK W WO CM Result Date: 11/23/2023 CLINICAL DATA:  Fall.  On anticoagulation. EXAM: CT ANGIOGRAPHY HEAD  AND NECK WITH AND WITHOUT CONTRAST TECHNIQUE: Multidetector CT imaging of the head and neck was performed using the standard protocol during bolus administration of intravenous contrast. Multiplanar CT image reconstructions and MIPs were obtained to evaluate the vascular anatomy. Carotid stenosis measurements (when applicable) are obtained utilizing NASCET criteria, using the distal internal carotid diameter as the denominator. RADIATION DOSE REDUCTION: This exam was performed according to the departmental dose-optimization program which includes automated exposure control, adjustment of the mA and/or kV according to patient size and/or use of iterative reconstruction technique. CONTRAST:  75mL OMNIPAQUE IOHEXOL 350 MG/ML SOLN COMPARISON:  Head CT 11/23/2023 and CTA head neck 02/12/2022 FINDINGS: CTA NECK FINDINGS Skeleton: No acute abnormality or high grade bony spinal canal stenosis. Other neck: Normal pharynx, larynx and major salivary glands. No cervical lymphadenopathy. Unremarkable thyroid gland. Upper chest: No pneumothorax or pleural effusion. No nodules or masses. Aortic arch: There is calcific atherosclerosis of the aortic arch. Conventional 3 vessel aortic branching pattern. RIGHT carotid system: No dissection, occlusion or aneurysm. Mild atherosclerotic calcification at the carotid bifurcation without hemodynamically significant stenosis. LEFT carotid system: No dissection, occlusion or aneurysm. Mild atherosclerotic calcification at the carotid bifurcation without hemodynamically significant stenosis. Vertebral arteries: Left dominant configuration. There is no dissection, occlusion or flow-limiting stenosis to the skull base (V1-V3 segments). CTA HEAD FINDINGS POSTERIOR CIRCULATION: Vertebral arteries are normal. No proximal occlusion of the anterior or inferior cerebellar arteries. Basilar artery is normal. Superior cerebellar arteries are normal. Posterior cerebral arteries are normal. ANTERIOR  CIRCULATION: Intracranial internal carotid arteries are normal. Anterior cerebral arteries are normal. Middle cerebral arteries are normal. Venous sinuses: As permitted by contrast timing, patent. Anatomic variants: None Review of the MIP images confirms the above findings. IMPRESSION: 1. No emergent large vessel occlusion or hemodynamically significant stenosis of the head or neck. 2. Mild bilateral carotid bifurcation atherosclerosis without hemodynamically significant stenosis. Aortic atherosclerosis (ICD10-I70.0). Electronically Signed   By: Deatra Robinson M.D.   On: 11/23/2023 22:59   CT Cervical Spine Wo Contrast Result Date: 11/23/2023 CLINICAL DATA:  Fall. EXAM: CT CERVICAL SPINE WITHOUT CONTRAST TECHNIQUE: Multidetector CT imaging of the cervical spine was performed without intravenous contrast. Multiplanar CT image reconstructions were also generated. RADIATION DOSE REDUCTION: This exam was performed according to the departmental dose-optimization program which includes automated exposure control, adjustment  of the mA and/or kV according to patient size and/or use of iterative reconstruction technique. COMPARISON:  None Available. FINDINGS: Alignment: Grade 1 degenerative anterolisthesis is present at C4-5 and C5-6. No other significant listhesis is present. Cervical lordosis is preserved. Skull base and vertebrae: Craniocervical junction is within normal limits. Degenerative changes are present C1-2. Vertebral body heights are normal. No acute fractures are present. Soft tissues and spinal canal: No prevertebral fluid or swelling. No visible canal hematoma. Atherosclerotic calcifications are present at the carotid bifurcations bilaterally. No significant stenosis is present. Disc levels: Uncovertebral and facet hypertrophy results in severe foraminal stenosis bilaterally at C3-4, moderate foraminal narrowing is worse on the right at C4-5. Moderate foraminal narrowing is present bilaterally at C5-6 and  C6-7. Asymmetric left-sided uncovertebral spurring leads to moderate left foraminal stenosis at C7-T1 Upper chest: Chronic scarring is present at the right lung apex. IMPRESSION: 1. No acute fracture or traumatic subluxation. 2. Multilevel degenerative changes of the cervical spine as described. 3. Severe foraminal stenosis bilaterally at C3-4, moderate foraminal narrowing is worse on the right at C4-5. 4. Moderate foraminal narrowing bilaterally at C5-6 and C6-7. 5. Moderate left foraminal stenosis at C7-T1. Electronically Signed   By: Marin Roberts M.D.   On: 11/23/2023 19:39   CT Head Wo Contrast Result Date: 11/23/2023 CLINICAL DATA:  Head trauma, minor. Patient fell backward in hit head on wooden ramp. EXAM: CT HEAD WITHOUT CONTRAST TECHNIQUE: Contiguous axial images were obtained from the base of the skull through the vertex without intravenous contrast. RADIATION DOSE REDUCTION: This exam was performed according to the departmental dose-optimization program which includes automated exposure control, adjustment of the mA and/or kV according to patient size and/or use of iterative reconstruction technique. COMPARISON:  CT head without contrast 02/12/2022 FINDINGS: Brain: Hyperdense focus in the anterior right sylvian fissure is consistent with subarachnoid hemorrhage. No other focal hemorrhage is present. Moderate generalized atrophy and diffuse white matter disease is otherwise stable. Cavum septum flu syndrome is noted. Ventricles are proportionate to the degree of atrophy. Deep brain nuclei are within normal limits. No other significant extra-axial fluid collection is present. The brainstem and cerebellum are within normal limits. Midline structures are within normal limits. Vascular: Atherosclerotic calcifications are present within the cavernous internal carotid arteries and at the dural margin of the left vertebral artery. No hyperdense vessel is present. Skull: Calvarium is intact. A posterior  right parietal scalp laceration and hematoma is present. No underlying fracture is present. No radiopaque foreign body is present. Sinuses/Orbits: The paranasal sinuses and mastoid air cells are clear. Bilateral lens replacements are noted. Globes and orbits are otherwise unremarkable. IMPRESSION: 1. Hyperdense focus in the anterior right Sylvian fissure is consistent with subarachnoid hemorrhage. 2. Posterior right parietal scalp laceration and hematoma without underlying fracture. 3. Stable atrophy and white matter disease. This likely reflects the sequela of chronic microvascular ischemia. Critical Value/emergent results were called by telephone at the time of interpretation on 11/23/2023 at 7:34 pm to provider Willy Eddy , who verbally acknowledged these results. Electronically Signed   By: Marin Roberts M.D.   On: 11/23/2023 19:34    EKG: Pending  Assessment/Plan Principal Problem:   SAH (subarachnoid hemorrhage) (HCC) Active Problems:   Orthostatic hypotension  (please populate well all problems here in Problem List. (For example, if patient is on BP meds at home and you resume or decide to hold them, it is a problem that needs to be her. Same for CAD, COPD, HLD and  so on)  Syncope Orthostatic hypotension - Continue fludrocortisone - Add midodrine 2.5 mg 3 times daily, can titrate up according to response and BP tolerance. - Reeducated patient regarding importance of know approximately changes of body positions and try to use aiding device such as cane or walker if Mast. - PT evaluation - Other DDx, patient denies any prodromes of lightheadedness palpitations nauseous before or during the episode.  She has no cardiac history in the past.  Will keep her on telemonitoring x 24 hours to rule out any significant arrhythmia, and check echocardiogram.  SAH, traumatic secondary to fall - Repeat CT head pending - Neurologically patient is intact - Hold off aspirin for at least 7 to 14  days  Orthostatic hypotension - As above  HTN - Continue metoprolol - Estimated patient is very sensitive to volume status, will discontinue standing dose of Lasix.  Discussed regarding importance of keep sufficient oral intake to keep volume status up and prefer to change Lasix to as needed other than standing dose.  DVT prophylaxis: TED house Code Status: Full code Family Communication: Husband and daughter at bedside Disposition Plan: Expect less than 2 midnight hospital stay Consults called: Neurosurgery Admission status: PCU observation   Emeline General MD [facility] Pager [phone]  11/24/2023, 10:04 AM

## 2023-11-25 DIAGNOSIS — Z8249 Family history of ischemic heart disease and other diseases of the circulatory system: Secondary | ICD-10-CM | POA: Diagnosis not present

## 2023-11-25 DIAGNOSIS — R296 Repeated falls: Secondary | ICD-10-CM | POA: Diagnosis present

## 2023-11-25 DIAGNOSIS — R636 Underweight: Secondary | ICD-10-CM | POA: Diagnosis present

## 2023-11-25 DIAGNOSIS — Z79899 Other long term (current) drug therapy: Secondary | ICD-10-CM | POA: Diagnosis not present

## 2023-11-25 DIAGNOSIS — Z7982 Long term (current) use of aspirin: Secondary | ICD-10-CM | POA: Diagnosis not present

## 2023-11-25 DIAGNOSIS — J449 Chronic obstructive pulmonary disease, unspecified: Secondary | ICD-10-CM | POA: Diagnosis present

## 2023-11-25 DIAGNOSIS — Z7952 Long term (current) use of systemic steroids: Secondary | ICD-10-CM | POA: Diagnosis not present

## 2023-11-25 DIAGNOSIS — W010XXA Fall on same level from slipping, tripping and stumbling without subsequent striking against object, initial encounter: Secondary | ICD-10-CM | POA: Diagnosis present

## 2023-11-25 DIAGNOSIS — S0101XA Laceration without foreign body of scalp, initial encounter: Secondary | ICD-10-CM | POA: Diagnosis present

## 2023-11-25 DIAGNOSIS — E785 Hyperlipidemia, unspecified: Secondary | ICD-10-CM | POA: Diagnosis present

## 2023-11-25 DIAGNOSIS — S066X1A Traumatic subarachnoid hemorrhage with loss of consciousness of 30 minutes or less, initial encounter: Secondary | ICD-10-CM | POA: Diagnosis present

## 2023-11-25 DIAGNOSIS — I2510 Atherosclerotic heart disease of native coronary artery without angina pectoris: Secondary | ICD-10-CM | POA: Diagnosis present

## 2023-11-25 DIAGNOSIS — Z853 Personal history of malignant neoplasm of breast: Secondary | ICD-10-CM | POA: Diagnosis not present

## 2023-11-25 DIAGNOSIS — Z79811 Long term (current) use of aromatase inhibitors: Secondary | ICD-10-CM | POA: Diagnosis not present

## 2023-11-25 DIAGNOSIS — Z681 Body mass index (BMI) 19 or less, adult: Secondary | ICD-10-CM | POA: Diagnosis not present

## 2023-11-25 DIAGNOSIS — Z79890 Hormone replacement therapy (postmenopausal): Secondary | ICD-10-CM | POA: Diagnosis not present

## 2023-11-25 DIAGNOSIS — R55 Syncope and collapse: Secondary | ICD-10-CM | POA: Diagnosis present

## 2023-11-25 DIAGNOSIS — I951 Orthostatic hypotension: Secondary | ICD-10-CM | POA: Diagnosis present

## 2023-11-25 DIAGNOSIS — I10 Essential (primary) hypertension: Secondary | ICD-10-CM | POA: Diagnosis present

## 2023-11-25 DIAGNOSIS — Z90710 Acquired absence of both cervix and uterus: Secondary | ICD-10-CM | POA: Diagnosis not present

## 2023-11-25 DIAGNOSIS — I609 Nontraumatic subarachnoid hemorrhage, unspecified: Secondary | ICD-10-CM | POA: Diagnosis not present

## 2023-11-25 DIAGNOSIS — Z7902 Long term (current) use of antithrombotics/antiplatelets: Secondary | ICD-10-CM | POA: Diagnosis not present

## 2023-11-25 MED ORDER — MIDODRINE HCL 5 MG PO TABS
2.5000 mg | ORAL_TABLET | Freq: Three times a day (TID) | ORAL | Status: DC
  Administered 2023-11-25 – 2023-11-26 (×3): 2.5 mg via ORAL
  Filled 2023-11-25 (×3): qty 1

## 2023-11-25 MED ORDER — MAGNESIUM HYDROXIDE 400 MG/5ML PO SUSP
15.0000 mL | Freq: Two times a day (BID) | ORAL | Status: DC | PRN
  Administered 2023-11-25: 15 mL via ORAL
  Filled 2023-11-25: qty 30

## 2023-11-25 MED ORDER — MIDODRINE HCL 5 MG PO TABS: 2.5000 mg | ORAL_TABLET | Freq: Three times a day (TID) | ORAL | Status: DC

## 2023-11-25 NOTE — Evaluation (Signed)
 Occupational Therapy Evaluation Patient Details Name: Judy Jenkins MRN: [id number] DOB: [birth year] Today's Date: 11/25/2023   History of Present Illness   Pt is an 89 year old female recurrent falls and intracranial bleed.         PMH significant for orthostatic hypotension on fludrocortisone, breast cancer on hormone therapy, HTN, HLD     Clinical Impressions Chart reviewed, pt greeted sitting on edge of bed with family present, agreeable to OT evaluation. Pt is alert and oriented x4, cognition appears WFL. PTA pt is MOD I-I in ADL/IADL, amb with no AD household distances, PRN SPC use for community distances. Pt presents with deficits in activity tolerance affecting safe and optimal ADL completion. Pt is symptomatic with position changes with vital signs as follows, team notified:   sitting: 118/62 (79) HR 86 bpm standing 61/46 (MAP 53) Hr 97 bpm sitting again: 105/62 (76) HR 85 bpm standing: 71/46 (55) HR 97 bpm standing 3 minutes 68/45 (53) HR 100bpm sitting 103/61 (MAP 73) HR 87 bpm.   Not symptomatic in sitting, feels weak/tired in standing; TEDs on throughout, slow transitions for position changes. OT will follow acutely to facilitate optimal ADL performance.      If plan is discharge home, recommend the following:   A little help with bathing/dressing/bathroom;Assistance with cooking/housework;Help with stairs or ramp for entrance     Functional Status Assessment   Patient has had a recent decline in their functional status and demonstrates the ability to make significant improvements in function in a reasonable and predictable amount of time.     Equipment Recommendations   Tub/shower seat     Recommendations for Other Services         Precautions/Restrictions   Precautions Precautions: Fall Recall of Precautions/Restrictions: Intact Restrictions Weight Bearing Restrictions Per Provider Order: No     Mobility Bed Mobility                General bed mobility comments: NT sitting on edge of bed pre/post session    Transfers Overall transfer level: Needs assistance Equipment used: Rolling walker (2 wheels) Transfers: Sit to/from Stand Sit to Stand: Contact guard assist                  Balance Overall balance assessment: Needs assistance Sitting-balance support: Feet supported Sitting balance-Leahy Scale: Normal     Standing balance support: Bilateral upper extremity supported, During functional activity, Reliant on assistive device for balance Standing balance-Leahy Scale: Good                             ADL either performed or assessed with clinical judgement   ADL Overall ADL's : Needs assistance/impaired Eating/Feeding: Set up;Sitting   Grooming: Set up;Sitting               Lower Body Dressing: Maximal assistance Lower Body Dressing Details (indicate cue type and reason): donn TEDs and socks Toilet Transfer: Contact guard assist;Rolling walker (2 wheels) Toilet Transfer Details (indicate cue type and reason): simulated, limited by symptomatic orthostatics         Functional mobility during ADLs: Contact guard assist;Rolling walker (2 wheels) (approx 4' forward and back, pt is sypmtomatic therefore further amb deferred)       Vision Patient Visual Report: No change from baseline       Perception Perception: Within Functional Limits       Praxis Praxis: [facility]  Pertinent Vitals/Pain Pain Assessment Pain Assessment: No/denies pain     Extremity/Trunk Assessment Upper Extremity Assessment Upper Extremity Assessment: Overall WFL for tasks assessed   Lower Extremity Assessment Lower Extremity Assessment: Overall WFL for tasks assessed       Communication Communication Communication: No apparent difficulties   Cognition Arousal: Alert Behavior During Therapy: WFL for tasks assessed/performed Cognition: No apparent impairments                                Following commands: Intact       Cueing  General Comments   Cueing Techniques: Verbal cues      Exercises Other Exercises Other Exercises: edu re: role of OT, role of rehab, discharge recommendations, safe ADL completion   Shoulder Instructions      Home Living Family/patient expects to be discharged to:: Private residence Living Arrangements: Spouse/significant other Available Help at Discharge: Family;Available 24 hours/day Type of Home: House Home Access: Ramped entrance     Home Layout: One level     Bathroom Shower/Tub: Chief Strategy Officer: Handicapped height Bathroom Accessibility: Yes   Home Equipment: Agricultural consultant (2 wheels);Cane - single point;Cane - quad;BSC/3in1          Prior Functioning/Environment               Mobility Comments: amb with no AD typically, PRN use of SPC community distances ADLs Comments: MOD I-I with ADL/IADL    OT Problem List: Decreased activity tolerance;Decreased knowledge of use of DME or AE   OT Treatment/Interventions: Self-care/ADL training;Therapeutic exercise;DME and/or AE instruction;Therapeutic activities;Patient/family education      OT Goals(Current goals can be found in the care plan section)   Acute Rehab OT Goals Patient Stated Goal: go home OT Goal Formulation: With patient/family Time For Goal Achievement: 12/09/23 Potential to Achieve Goals: Good ADL Goals Pt Will Perform Grooming: with modified independence Pt Will Perform Lower Body Dressing: with modified independence;sit to/from stand;sitting/lateral leans Pt Will Transfer to Toilet: with modified independence;ambulating Pt Will Perform Toileting - Clothing Manipulation and hygiene: with modified independence;sitting/lateral leans;sit to/from stand   OT Frequency:  Min 2X/week    Co-evaluation              AM-PAC OT "6 Clicks" Daily Activity     Outcome Measure Help from another person eating meals?:  None Help from another person taking care of personal grooming?: None Help from another person toileting, which includes using toliet, bedpan, or urinal?: None Help from another person bathing (including washing, rinsing, drying)?: A Little Help from another person to put on and taking off regular upper body clothing?: None Help from another person to put on and taking off regular lower body clothing?: A Lot 6 Click Score: 21   End of Session Equipment Utilized During Treatment: Rolling walker (2 wheels) Nurse Communication: Mobility status  Activity Tolerance: Patient tolerated treatment well Patient left: with call bell/phone within reach;with family/visitor present (sitting on edge of bed)  OT Visit Diagnosis: Other abnormalities of gait and mobility (R26.89)                Time: 5784-6962 OT Time Calculation (min): 32 min Charges:  OT General Charges $OT Visit: 1 Visit OT Evaluation $OT Eval Moderate Complexity: 1 Mod  Oleta Mouse, OTD OTR/L  11/25/23, 10:58 AM

## 2023-11-25 NOTE — Evaluation (Signed)
 Physical Therapy Evaluation Patient Details Name: Judy Jenkins MRN: [id number] DOB: [birth year] Today's Date: 11/25/2023  History of Present Illness  Judy Jenkins is an 89yoF who comes [facility]/24/23 after backwards mechanical fall, hit impact on ramp. PMH: orthostatic hypotension on fludrocortisone, BrCA, HTN, HLD, previous falls. ED workup showed small 5 mm SAH in the sylvian fissure.  And the patient was reassured and sent home from ED however while in the parking lot patient fell again when trying to enter a car.  And she had to double back to ER for further evaluation.  Neurosurgery recommended repeat CT this morning. MD added midodrine 2.5mg  TID and ordered PT evaluation.  Clinical Impression  Pt in bed on entry, s/p first midodrine dose by ~30 minutes. Family at bedside. Pt moving quite well in general, no physical assist needed for any mobility. Pt has no symptoms while in session, no LOB- albeit pt does not really have any associated symptoms with her chronic orthostasis anymore, aside from generalized weakness of legs. Pt maintains standing at bedside for ~ 3 minutes for balance assessment, then BP assess shows marked drop, however much improved compared to an hour earlier prior to midodrine administration. Pt has been on mineralocorticoids for years for this problem with what sounds like successful management, however has not this degree of exacerbation (to the point of syncope in the past), unclear why so much worse at present. Will continue to follow. DTR confirms 2 episodes of syncope associated with this admission. Will continue to follow.       If plan is discharge home, recommend the following: Direct supervision/assist for financial management;Assist for transportation;Help with stairs or ramp for entrance   Can travel by private vehicle        Equipment Recommendations None recommended by PT  Recommendations for Other Services       Functional Status Assessment  Patient has had a recent decline in their functional status and demonstrates the ability to make significant improvements in function in a reasonable and predictable amount of time.     Precautions / Restrictions Precautions Precautions: Fall Recall of Precautions/Restrictions: Intact Restrictions Weight Bearing Restrictions Per Provider Order: No      Mobility  Bed Mobility Overal bed mobility: Modified Independent, Needs Assistance Bed Mobility: Supine to Sit     Supine to sit: Modified independent (Device/Increase time)          Transfers Overall transfer level: Needs assistance Equipment used: None Transfers: Sit to/from Stand Sit to Stand: Supervision, Contact guard assist           General transfer comment: no LOB, no symptoms    Ambulation/Gait Ambulation/Gait assistance:  (deferred at eval; spent time on standing balance assessment adn BP assessment)                Stairs            Wheelchair Mobility     Tilt Bed    Modified Rankin (Stroke Patients Only)       Balance                             High level balance activites: Side stepping, Turns, Head turns High Level Balance Comments: at bedside perfomred the following: 180 degree turns, marching in place, static eyes closed, horizonal and vertical head turns: no LOB with any of these, albeit pt reports vertical head turns as provocative at baseline. (after all of  this, BP assessed at 89/41mmHg (40 minutes s/p 2.5mg  Midodrine))             Pertinent Vitals/Pain Pain Assessment Pain Assessment: No/denies pain    Home Living Family/patient expects to be discharged to:: Private residence Living Arrangements: Spouse/significant other Available Help at Discharge: Family;Available 24 hours/day Type of Home: House Home Access: Ramped entrance       Home Layout: One level Home Equipment: Agricultural consultant (2 wheels);Cane - single point;Cane - quad;BSC/3in1       Prior Function               Mobility Comments: amb with no AD typically, PRN use of SPC or RW for community distances when needed ADLs Comments: MOD I-I with ADL/IADL     Extremity/Trunk Assessment                Communication        Cognition Arousal: Alert Behavior During Therapy: WFL for tasks assessed/performed   PT - Cognitive impairments: No apparent impairments                                 Cueing       General Comments      Exercises     Assessment/Plan    PT Assessment Patient needs continued PT services  PT Problem List Decreased activity tolerance;Decreased mobility       PT Treatment Interventions Gait training;Stair training;Functional mobility training;Therapeutic activities;Therapeutic exercise;Balance training;Neuromuscular re-education;Patient/family education    PT Goals (Current goals can be found in the Care Plan section)  Acute Rehab PT Goals Patient Stated Goal: prevent additional falls/syncope. PT Goal Formulation: With patient/family Time For Goal Achievement: 12/09/23 Potential to Achieve Goals: Fair    Frequency Min 3X/week     Co-evaluation               AM-PAC PT "6 Clicks" Mobility  Outcome Measure Help needed turning from your back to your side while in a flat bed without using bedrails?: None Help needed moving from lying on your back to sitting on the side of a flat bed without using bedrails?: None Help needed moving to and from a bed to a chair (including a wheelchair)?: A Little Help needed standing up from a chair using your arms (e.g., wheelchair or bedside chair)?: A Little Help needed to walk in hospital room?: A Little Help needed climbing 3-5 steps with a railing? : A Little 6 Click Score: 20    End of Session   Activity Tolerance: Patient tolerated treatment well;No increased pain;Patient limited by fatigue Patient left: in bed;with call bell/phone within reach;with  nursing/sitter in room Nurse Communication: Mobility status PT Visit Diagnosis: History of falling (Z91.81);Other symptoms and signs involving the nervous system (R29.898)    Time: 0944-1000 PT Time Calculation (min) (ACUTE ONLY): 16 min   Charges:   PT Evaluation $PT Eval Moderate Complexity: 1 Mod   PT General Charges $$ ACUTE PT VISIT: 1 Visit        4:05 PM, 11/25/23 Rosamaria Lints, PT, DPT Physical Therapist - [facility]  [phone] (ASCOM)    Dionne Rossa C 11/25/2023, 4:00 PM

## 2023-11-25 NOTE — Plan of Care (Signed)

## 2023-11-25 NOTE — Progress Notes (Signed)
 PROGRESS NOTE    Judy Jenkins  JWJ:[id number] DOB: 1934-11-28 DOA: 11/24/2023 PCP: [facility]    Brief Narrative:  89 y.o. female with medical history significant of orthostatic hypotension on fludrocortisone, breast cancer on hormone therapy, HTN, HLD, presented with recurrent falls and intracranial bleed.   Patient has a history of orthostatic hypotension, despite on fludrocortisone and compression stocking, symptoms are persistent.  For example, she mentioned that yesterday she checked her blood pressure while lying down SBP 170/60 however when she sits up and check her blood pressure again SBP was 70.  She has had the problem for at least 3 to 4 years and she learned to gradual and slow body position changes from lying down to sitting up and standing up.  However yesterday, she fell after she is standing up which she attributed " might have got up too fast" and hit her head on the back and loss of consciousness briefly.  ED workup showed small 5 mm SAH in the sylvian fissure.  And the patient was reassured and sent home from ED however while in the parking lot patient fell again when trying to enter a car.  And she had to double back to ER for further evaluation.  Neurosurgery recommended repeat CT this morning.  Currently patient has no complaints, denied any lightheadedness headache blurry vision nauseous vomiting.  No numbness weakness of any other limbs.  Denied any vertigo.   Assessment & Plan:   Principal Problem:   SAH (subarachnoid hemorrhage) (HCC) Active Problems:   Orthostatic hypotension   Recurrent syncope Orthostatic hypotension Patient had several syncopal events after an ER visit after mechanical fall with small SDH.  Remains markedly orthostatic and symptomatic with over 40 point drop in systolic blood pressure upon standing.  Echocardiogram reassuring Plan: Continue very low-dose Florinef Start midodrine 2.5 mg 3 times daily Uptitrate to  response  SAH, traumatic secondary to fall Scalp hematoma Repeat head CT reassuring Plan: Hold aspirin for 7 to 14 days   HTN - Continue metoprolol -Hold Lasix   DVT prophylaxis: SCD Code Status: Full Family Communication: Daughter at bedside 4/6 Disposition Plan: Status is: Observation The patient will require care spanning > 2 midnights and should be moved to inpatient because: Marked orthostatic hypotension with symptoms.  Needs titration in midodrine and Florinef regimen.  Precludes safe discharge at this time.   Level of care: Progressive  Consultants:  None  Procedures:  None  Antimicrobials: None    Subjective: Seen and examined.  Family members at bedside.  Patient feels well overall.  Objective: Vitals:   11/24/23 2352 11/25/23 0310 11/25/23 0359 11/25/23 0806  BP: (!) 151/63  (!) 114/55 102/60  Pulse: 78  74 74  Resp: 16 16 16 17   Temp: 98 F (36.7 C)  98.1 F (36.7 C) 98.4 F (36.9 C)  TempSrc: Oral  Oral Oral  SpO2: 100%  98% 100%  Weight:      Height:        Intake/Output Summary (Last 24 hours) at 11/25/2023 1409 Last data filed at 11/25/2023 0800 Gross per 24 hour  Intake --  Output 600 ml  Net -600 ml   Filed Weights   11/24/23 0513 11/24/23 0802  Weight: 54.4 kg 50.1 kg    Examination:  General Jenkins: Appears calm and comfortable  Respiratory system: Clear to auscultation. Respiratory effort normal. Cardiovascular system: S1-S2, RRR, no murmurs, no pedal edema Gastrointestinal system: Soft, NT/ND, normal bowel sounds Central nervous  system: Alert and oriented. No focal neurological deficits. Extremities: Symmetric 5 x 5 power. Skin: Posterior scalp hematoma Psychiatry: Judgement and insight appear normal. Mood & affect appropriate.     Data Reviewed: I have personally reviewed following labs and imaging studies  CBC: Recent Labs  Lab 11/23/23 2138  WBC 8.8  NEUTROABS 5.8  HGB 11.5*  HCT 34.8*  MCV 93.3  PLT 241    Basic Metabolic Panel: Recent Labs  Lab 11/23/23 2138  NA 138  K 3.4*  CL 101  CO2 30  GLUCOSE 109*  BUN 17  CREATININE 0.98  CALCIUM 9.1   GFR: Estimated Creatinine Clearance: 32 mL/min (by C-G formula based on SCr of 0.98 mg/dL). Liver Function Tests: Recent Labs  Lab 11/23/23 2138  AST 32  ALT 26  ALKPHOS 53  BILITOT 0.6  PROT 6.3*  ALBUMIN 3.7   No results for input(s): "LIPASE", "AMYLASE" in the last 168 hours. No results for input(s): "AMMONIA" in the last 168 hours. Coagulation Profile: Recent Labs  Lab 11/23/23 2138  INR 1.0   Cardiac Enzymes: No results for input(s): "CKTOTAL", "CKMB", "CKMBINDEX", "TROPONINI" in the last 168 hours. BNP (last 3 results) No results for input(s): "PROBNP" in the last 8760 hours. HbA1C: No results for input(s): "HGBA1C" in the last 72 hours. CBG: Recent Labs  Lab 11/24/23 0807  GLUCAP 83   Lipid Profile: No results for input(s): "CHOL", "HDL", "LDLCALC", "TRIG", "CHOLHDL", "LDLDIRECT" in the last 72 hours. Thyroid Function Tests: No results for input(s): "TSH", "T4TOTAL", "FREET4", "T3FREE", "THYROIDAB" in the last 72 hours. Anemia Panel: No results for input(s): "VITAMINB12", "FOLATE", "FERRITIN", "TIBC", "IRON", "RETICCTPCT" in the last 72 hours. Sepsis Labs: No results for input(s): "PROCALCITON", "LATICACIDVEN" in the last 168 hours.  Recent Results (from the past 240 hours)  MRSA Next Gen by PCR, Nasal     Status: None   Collection Time: 11/24/23  8:05 AM   Specimen: Nasal Mucosa; Nasal Swab  Result Value Ref Range Status   MRSA by PCR Next Gen NOT DETECTED NOT DETECTED Final    Comment: (NOTE) The GeneXpert MRSA Assay (FDA approved for NASAL specimens only), is one component of a comprehensive MRSA colonization surveillance program. It is not intended to diagnose MRSA infection nor to guide or monitor treatment for MRSA infections. Test performance is not FDA approved in patients less than 54  years old. Performed at [facility] [address], [place], [place] [address]          Radiology Studies: ECHOCARDIOGRAM COMPLETE Result Date: 11/24/2023    ECHOCARDIOGRAM REPORT   Patient Name:   Judy Jenkins: 11/24/2023 Medical Rec #:  [id number]            Height:       67.0 in Accession #:    [id number]           Weight:       110.4 lb Date of Birth:  05/07/1935             BSA:          1.571 m Patient Age:    89 years             BP:           144/73 mmHg Patient Gender: F                    HR:  86 bpm. Jenkins Location:  [facility] Procedure: 2D Echo, Cardiac Doppler and Color Doppler (Both Spectral and Color            Flow Doppler were utilized during procedure). Indications:     Syncope R55  History:         Patient has prior history of Echocardiogram examinations, most                  recent 02/13/2022.  Sonographer:     Overton Mam RDCS, FASE Referring Phys:  [id number] [place] General Diagnosing Phys: Alwyn Pea MD IMPRESSIONS  1. Left ventricular ejection fraction, by estimation, is 65 to 70%. The left ventricle has normal function. The left ventricle has no regional wall motion abnormalities. Left ventricular diastolic parameters are consistent with Grade I diastolic dysfunction (impaired relaxation).  2. Right ventricular systolic function is normal. The right ventricular size is normal.  3. The mitral valve is normal in structure. No evidence of mitral valve regurgitation.  4. The aortic valve is normal in structure. Aortic valve regurgitation is not visualized.  5. Aortic dilatation noted. FINDINGS  Left Ventricle: Left ventricular ejection fraction, by estimation, is 65 to 70%. The left ventricle has normal function. The left ventricle has no regional wall motion abnormalities. Strain was performed and the global longitudinal strain is indeterminate. Global longitudinal strain performed but not reported based on interpreter judgement due to  suboptimal tracking. The left ventricular internal cavity size was normal in size. There is no left ventricular hypertrophy. Left ventricular diastolic parameters are consistent with Grade I diastolic dysfunction (impaired relaxation). Right Ventricle: The right ventricular size is normal. No increase in right ventricular wall thickness. Right ventricular systolic function is normal. Left Atrium: Left atrial size was normal in size. Right Atrium: Right atrial size was normal in size. Pericardium: There is no evidence of pericardial effusion. Mitral Valve: The mitral valve is normal in structure. No evidence of mitral valve regurgitation. Tricuspid Valve: The tricuspid valve is normal in structure. Tricuspid valve regurgitation is not demonstrated. Aortic Valve: The aortic valve is normal in structure. Aortic valve regurgitation is not visualized. Aortic valve peak gradient measures 5.2 mmHg. Pulmonic Valve: The pulmonic valve was normal in structure. Pulmonic valve regurgitation is not visualized. Aorta: The ascending aorta was not well visualized and aortic dilatation noted. IAS/Shunts: No atrial level shunt detected by color flow Doppler. Additional Comments: 3D was performed not requiring image post processing on an independent workstation and was indeterminate.  LEFT VENTRICLE PLAX 2D LVIDd:         3.20 cm   Diastology LVIDs:         2.10 cm   LV e' medial:    5.55 cm/s LV PW:         1.10 cm   LV E/e' medial:  10.7 LV IVS:        0.80 cm   LV e' lateral:   6.53 cm/s LVOT diam:     1.90 cm   LV E/e' lateral: 9.1 LV SV:         46 LV SV Index:   29 LVOT Area:     2.84 cm  RIGHT VENTRICLE RV Basal diam:  3.20 cm RV S prime:     19.90 cm/s TAPSE (M-mode): 2.3 cm LEFT ATRIUM             Index       RIGHT ATRIUM  Index LA diam:        1.60 cm 1.02 cm/m  RA Area:     10.59 cm LA Vol (A2C):   12.4 ml 7.89 ml/m  RA Volume:   24.55 ml  15.62 ml/m LA Vol (A4C):   9.1 ml  5.77 ml/m LA Biplane Vol: 11.4 ml  7.26 ml/m  AORTIC VALVE                 PULMONIC VALVE AV Area (Vmax): 2.34 cm     PV Vmax:        0.91 m/s AV Vmax:        114.00 cm/s  PV Peak grad:   3.3 mmHg AV Peak Grad:   5.2 mmHg     RVOT Peak grad: 3 mmHg LVOT Vmax:      94.20 cm/s LVOT Vmean:     70.300 cm/s LVOT VTI:       0.161 m  AORTA Ao Root diam: 3.30 cm Ao Asc diam:  3.20 cm MITRAL VALVE                TRICUSPID VALVE MV Area (PHT): 3.63 cm     TR Peak grad:   31.6 mmHg MV Decel Time: 209 msec     TR Vmax:        281.00 cm/s MV E velocity: 59.20 cm/s MV A velocity: 106.00 cm/s  SHUNTS MV E/A ratio:  0.56         Systemic VTI:  0.16 m                             Systemic Diam: 1.90 cm Alwyn Pea MD Electronically signed by Alwyn Pea MD Signature Date/Time: 11/24/2023/3:36:46 PM    Final    CT Head Wo Contrast Result Date: 11/24/2023 CLINICAL DATA:  89 year old female status post fall backwards, small volume subarachnoid hemorrhage. Jenkins: CT HEAD WITHOUT CONTRAST TECHNIQUE: Contiguous axial images were obtained from the base of the skull through the vertex without intravenous contrast. RADIATION DOSE REDUCTION: This Jenkins was performed according to the departmental dose-optimization program which includes automated exposure control, adjustment of the mA and/or kV according to patient size and/or use of iterative reconstruction technique. COMPARISON:  Head CT 0229 hours today and earlier. FINDINGS: Brain: Trace suspected subarachnoid hemorrhage within the sulcus at the right inferior frontal gyrus series 2, image 14 appears unchanged since presentation yesterday. No IVH or ventriculomegaly. Cavum septum lucent, normal variant. No intracranial mass effect or midline shift. Stable gray-white matter differentiation throughout the brain. No new intracranial hemorrhage identified. Vascular: No suspicious intracranial vascular hyperdensity. Calcified atherosclerosis at the skull base. Skull: Osteopenia.  Stable. Sinuses/Orbits: Low-density  fluid or mucosal thickening in the left maxillary sinus appears inflammatory, with previous sinus surgery on that side. Stable sinus aeration. Tympanic cavities and mastoids well aerated. Other: Broad-based posterior scalp hematoma along the left convexity has progressed since yesterday. Evidence of associated skin laceration. Underlying calvarium there appears stable and intact. Negative orbits soft tissues. IMPRESSION: 1. Trace SAH at the right frontal gyrus stable since yesterday. No new intracranial abnormality. 2. Progressed Left posterior scalp hematoma, no skull fracture identified. Electronically Signed   By: Odessa Fleming M.D.   On: 11/24/2023 09:47   CT Head Wo Contrast Result Date: 11/24/2023 CLINICAL DATA:  Status post trauma, follow-up subarachnoid hemorrhage. Jenkins: CT HEAD WITHOUT CONTRAST TECHNIQUE: Contiguous axial images were obtained from the base  of the skull through the vertex without intravenous contrast. RADIATION DOSE REDUCTION: This Jenkins was performed according to the departmental dose-optimization program which includes automated exposure control, adjustment of the mA and/or kV according to patient size and/or use of iterative reconstruction technique. COMPARISON:  November 23, 2023 FINDINGS: Brain: There is generalized cerebral atrophy with widening of the extra-axial spaces and ventricular dilatation. There are areas of decreased attenuation within the white matter tracts of the supratentorial brain, consistent with microvascular disease changes. A predominantly stable 5 mm hyperdense area is seen within the anterior aspect of the sylvian fissure on the right. This is seen on the prior study. There is no evidence of associated mass effect or midline shift. Cavum septum pellucidum is noted. Vascular: No hyperdense vessel or unexpected calcification. Skull: Normal. Negative for fracture or focal lesion. Sinuses/Orbits: No acute finding. Other: Right posterior parietal scalp soft tissue swelling is  noted. IMPRESSION: 1. Predominantly stable 5 mm hyperdense area within the anterior aspect of the Sylvian fissure on the right, consistent with a small amount of subarachnoid hemorrhage. 2. Generalized cerebral atrophy and microvascular disease changes of the supratentorial brain. 3. Right posterior parietal scalp soft tissue swelling. Electronically Signed   By: Aram Candela M.D.   On: 11/24/2023 02:46   CT ANGIO HEAD NECK W WO CM Result Date: 11/23/2023 CLINICAL DATA:  Fall.  On anticoagulation. Jenkins: CT ANGIOGRAPHY HEAD AND NECK WITH AND WITHOUT CONTRAST TECHNIQUE: Multidetector CT imaging of the head and neck was performed using the standard protocol during bolus administration of intravenous contrast. Multiplanar CT image reconstructions and MIPs were obtained to evaluate the vascular anatomy. Carotid stenosis measurements (when applicable) are obtained utilizing NASCET criteria, using the distal internal carotid diameter as the denominator. RADIATION DOSE REDUCTION: This Jenkins was performed according to the departmental dose-optimization program which includes automated exposure control, adjustment of the mA and/or kV according to patient size and/or use of iterative reconstruction technique. CONTRAST:  75mL OMNIPAQUE IOHEXOL 350 MG/ML SOLN COMPARISON:  Head CT 11/23/2023 and CTA head neck 02/12/2022 FINDINGS: CTA NECK FINDINGS Skeleton: No acute abnormality or high grade bony spinal canal stenosis. Other neck: Normal pharynx, larynx and major salivary glands. No cervical lymphadenopathy. Unremarkable thyroid gland. Upper chest: No pneumothorax or pleural effusion. No nodules or masses. Aortic arch: There is calcific atherosclerosis of the aortic arch. Conventional 3 vessel aortic branching pattern. RIGHT carotid system: No dissection, occlusion or aneurysm. Mild atherosclerotic calcification at the carotid bifurcation without hemodynamically significant stenosis. LEFT carotid system: No dissection,  occlusion or aneurysm. Mild atherosclerotic calcification at the carotid bifurcation without hemodynamically significant stenosis. Vertebral arteries: Left dominant configuration. There is no dissection, occlusion or flow-limiting stenosis to the skull base (V1-V3 segments). CTA HEAD FINDINGS POSTERIOR CIRCULATION: Vertebral arteries are normal. No proximal occlusion of the anterior or inferior cerebellar arteries. Basilar artery is normal. Superior cerebellar arteries are normal. Posterior cerebral arteries are normal. ANTERIOR CIRCULATION: Intracranial internal carotid arteries are normal. Anterior cerebral arteries are normal. Middle cerebral arteries are normal. Venous sinuses: As permitted by contrast timing, patent. Anatomic variants: None Review of the MIP images confirms the above findings. IMPRESSION: 1. No emergent large vessel occlusion or hemodynamically significant stenosis of the head or neck. 2. Mild bilateral carotid bifurcation atherosclerosis without hemodynamically significant stenosis. Aortic atherosclerosis (ICD10-I70.0). Electronically Signed   By: Deatra Robinson M.D.   On: 11/23/2023 22:59   CT Cervical Spine Wo Contrast Result Date: 11/23/2023 CLINICAL DATA:  Fall. Jenkins: CT CERVICAL SPINE WITHOUT  CONTRAST TECHNIQUE: Multidetector CT imaging of the cervical spine was performed without intravenous contrast. Multiplanar CT image reconstructions were also generated. RADIATION DOSE REDUCTION: This Jenkins was performed according to the departmental dose-optimization program which includes automated exposure control, adjustment of the mA and/or kV according to patient size and/or use of iterative reconstruction technique. COMPARISON:  None Available. FINDINGS: Alignment: Grade 1 degenerative anterolisthesis is present at C4-5 and C5-6. No other significant listhesis is present. Cervical lordosis is preserved. Skull base and vertebrae: Craniocervical junction is within normal limits. Degenerative  changes are present C1-2. Vertebral body heights are normal. No acute fractures are present. Soft tissues and spinal canal: No prevertebral fluid or swelling. No visible canal hematoma. Atherosclerotic calcifications are present at the carotid bifurcations bilaterally. No significant stenosis is present. Disc levels: Uncovertebral and facet hypertrophy results in severe foraminal stenosis bilaterally at C3-4, moderate foraminal narrowing is worse on the right at C4-5. Moderate foraminal narrowing is present bilaterally at C5-6 and C6-7. Asymmetric left-sided uncovertebral spurring leads to moderate left foraminal stenosis at C7-T1 Upper chest: Chronic scarring is present at the right lung apex. IMPRESSION: 1. No acute fracture or traumatic subluxation. 2. Multilevel degenerative changes of the cervical spine as described. 3. Severe foraminal stenosis bilaterally at C3-4, moderate foraminal narrowing is worse on the right at C4-5. 4. Moderate foraminal narrowing bilaterally at C5-6 and C6-7. 5. Moderate left foraminal stenosis at C7-T1. Electronically Signed   By: Marin Roberts M.D.   On: 11/23/2023 19:39   CT Head Wo Contrast Result Date: 11/23/2023 CLINICAL DATA:  Head trauma, minor. Patient fell backward in hit head on wooden ramp. Jenkins: CT HEAD WITHOUT CONTRAST TECHNIQUE: Contiguous axial images were obtained from the base of the skull through the vertex without intravenous contrast. RADIATION DOSE REDUCTION: This Jenkins was performed according to the departmental dose-optimization program which includes automated exposure control, adjustment of the mA and/or kV according to patient size and/or use of iterative reconstruction technique. COMPARISON:  CT head without contrast 02/12/2022 FINDINGS: Brain: Hyperdense focus in the anterior right sylvian fissure is consistent with subarachnoid hemorrhage. No other focal hemorrhage is present. Moderate generalized atrophy and diffuse white matter disease is  otherwise stable. Cavum septum flu syndrome is noted. Ventricles are proportionate to the degree of atrophy. Deep brain nuclei are within normal limits. No other significant extra-axial fluid collection is present. The brainstem and cerebellum are within normal limits. Midline structures are within normal limits. Vascular: Atherosclerotic calcifications are present within the cavernous internal carotid arteries and at the dural margin of the left vertebral artery. No hyperdense vessel is present. Skull: Calvarium is intact. A posterior right parietal scalp laceration and hematoma is present. No underlying fracture is present. No radiopaque foreign body is present. Sinuses/Orbits: The paranasal sinuses and mastoid air cells are clear. Bilateral lens replacements are noted. Globes and orbits are otherwise unremarkable. IMPRESSION: 1. Hyperdense focus in the anterior right Sylvian fissure is consistent with subarachnoid hemorrhage. 2. Posterior right parietal scalp laceration and hematoma without underlying fracture. 3. Stable atrophy and white matter disease. This likely reflects the sequela of chronic microvascular ischemia. Critical Value/emergent results were called by telephone at the time of interpretation on 11/23/2023 at 7:34 pm to provider Willy Eddy , who verbally acknowledged these results. Electronically Signed   By: Marin Roberts M.D.   On: 11/23/2023 19:34        Scheduled Meds:  amitriptyline  25 mg Oral QHS   Chlorhexidine Gluconate Cloth  6 each  Topical Q0600   fludrocortisone  0.05 mg Oral Daily   letrozole  2.5 mg Oral Daily   levothyroxine  75 mcg Oral Q0600   metoprolol succinate  25 mg Oral BID   midodrine  2.5 mg Oral TID WC   pantoprazole  40 mg Oral Daily   rosuvastatin  10 mg Oral Daily   Continuous Infusions:   LOS: 1 day    Tresa Moore, MD [facility]   If 7PM-7AM, please contact night-coverage  11/25/2023, 2:09 PM

## 2023-11-26 DIAGNOSIS — I609 Nontraumatic subarachnoid hemorrhage, unspecified: Secondary | ICD-10-CM | POA: Diagnosis not present

## 2023-11-26 MED ORDER — MIDODRINE HCL 5 MG PO TABS
5.0000 mg | ORAL_TABLET | Freq: Three times a day (TID) | ORAL | Status: DC
  Administered 2023-11-26 – 2023-11-27 (×3): 5 mg via ORAL
  Filled 2023-11-26 (×3): qty 1

## 2023-11-26 MED ORDER — FLUDROCORTISONE ACETATE 0.1 MG PO TABS
0.1000 mg | ORAL_TABLET | Freq: Every day | ORAL | Status: DC
  Administered 2023-11-26 – 2023-11-27 (×2): 0.1 mg via ORAL
  Filled 2023-11-26 (×2): qty 1

## 2023-11-26 NOTE — Plan of Care (Signed)

## 2023-11-26 NOTE — TOC Progression Note (Signed)
 Transition of Care [facility]) - Progression Note    Patient Details  Name: Judy Jenkins MRN: [id number] Date of Birth: 18-Feb-1935  [facility]) CM/SW Contact  Truddie Hidden, RN Phone Number: 11/26/2023, 3:24 PM  Clinical Narrative:    Spoke with patient and her spouse at the bedside regarding therapy's recommendation for [facility] PT. She is agreeable to [facility] and was provided choices for [facility], [facility] and [facility]. Patient does not have a choice of a HH agency. Patient advised the accepting agency will contact her directly to scheduled SOC within 48 post discharge. Patient's spouse was at the bedside and will transport her her home at discharge.   Referral sent to [facility] from [facility].     3:28pm Per therapy's recommendation HH PT is no longer indicated. Patient is agreeable to receive 4 wheeled walker.  Request for 4 wheeled walker sent to Jon from [facility].          Expected Discharge Plan and Services                                               Social Determinants of Health (SDOH) Interventions SDOH Screenings   Food Insecurity: No Food Insecurity (11/24/2023)  Housing: Low Risk  (11/24/2023)  Transportation Needs: No Transportation Needs (11/24/2023)  Utilities: Not At Risk (11/24/2023)  Financial Resource Strain: Low Risk  (10/15/2023)   Received from [facility]  Social Connections: Moderately Integrated (11/24/2023)  Tobacco Use: Low Risk  (11/24/2023)    Readmission Risk Interventions     No data to display

## 2023-11-26 NOTE — Progress Notes (Signed)
 Patient transferred to room 259 by this RN and NT. Pt left in care of [place], [place]. Pt stated she would notify daughter of room change. Belongings sent with patient (clothes, shoes and cell phone).

## 2023-11-26 NOTE — Progress Notes (Signed)
 Physical Therapy Treatment Patient Details Name: Judy Jenkins MRN: [id number] DOB: 1935-01-09 Today's Date: 11/26/2023   History of Present Illness Javeah Loeza is an 89yoF who comes to [facility] 11/24/23 after backwards mechanical fall, hit impact on ramp. PMH: orthostatic hypotension on fludrocortisone, BrCA, HTN, HLD, previous falls. ED workup showed small 5 mm SAH in the sylvian fissure.  And the patient was reassured and sent home from ED however while in the parking lot patient fell again when trying to enter a car.  And she had to double back to ER for further evaluation.  Neurosurgery recommended repeat CT this morning. MD added midodrine 2.5mg  TID and ordered PT evaluation.    PT Comments  Patient agreeable to participation in PT treatment session. Patient continues to experience symptoms of "weakness and numbness all over" with prolonged standing (~5 minutes max). Able to complete multiple bouts of ambulation with seated rest breaks between. BP continues to remain variable and dropping with transitions. Educated patient on need for counting prior to mobilizing to assess for symptoms, patient verbalized understanding. Discussed with patient and family about benefits of rollator for always having a seat available if symptoms do arise. Patient mobilizes well with supervision for safety and would be able to safely ambulate with rollator. Discharge plan remains appropriate.    Orthostatic BPs  Supine 121/57  Sitting 96/51  Standing 65/55  Sitting 120/55  Standing 90/41  Standing x 1 min 90/43  Standing x 3 min 85/53  After 20' ambulation - unable to stay standing due to symptoms  95/47  Standing  77/43  After 30' ambulation - symptoms noted 72/47     If plan is discharge home, recommend the following: Direct supervision/assist for financial management;Assist for transportation;Help with stairs or ramp for entrance   Can travel by private vehicle        Equipment Recommendations   Rollator (4 wheels)    Recommendations for Other Services       Precautions / Restrictions Precautions Precautions: Fall Recall of Precautions/Restrictions: Intact Restrictions Weight Bearing Restrictions Per Provider Order: No     Mobility  Bed Mobility Overal bed mobility: Modified Independent                  Transfers Overall transfer level: Modified independent Equipment used: None                    Ambulation/Gait Ambulation/Gait assistance: Contact guard assist Gait Distance (Feet): 20 Feet (+30', +20') Assistive device: 1 person hand held assist, None Gait Pattern/deviations: Step-through pattern, Decreased stride length Gait velocity: decreased         Stairs             Wheelchair Mobility     Tilt Bed    Modified Rankin (Stroke Patients Only)       Balance Overall balance assessment: Needs assistance Sitting-balance support: Feet supported Sitting balance-Leahy Scale: Normal     Standing balance support: During functional activity, Reliant on assistive device for balance, No upper extremity supported Standing balance-Leahy Scale: Good                              Communication Communication Communication: No apparent difficulties  Cognition Arousal: Alert Behavior During Therapy: WFL for tasks assessed/performed   PT - Cognitive impairments: No apparent impairments  Following commands: Intact      Cueing Cueing Techniques: Verbal cues  Exercises      General Comments        Pertinent Vitals/Pain Pain Assessment Pain Assessment: No/denies pain    Home Living                          Prior Function            PT Goals (current goals can now be found in the care plan section) Acute Rehab PT Goals Patient Stated Goal: prevent additional falls/syncope. PT Goal Formulation: With patient/family Time For Goal Achievement: 12/09/23 Potential to  Achieve Goals: Fair Progress towards PT goals: Progressing toward goals    Frequency    Min 3X/week      PT Plan      Co-evaluation              AM-PAC PT "6 Clicks" Mobility   Outcome Measure  Help needed turning from your back to your side while in a flat bed without using bedrails?: None Help needed moving from lying on your back to sitting on the side of a flat bed without using bedrails?: None Help needed moving to and from a bed to a chair (including a wheelchair)?: A Little Help needed standing up from a chair using your arms (e.g., wheelchair or bedside chair)?: A Little Help needed to walk in hospital room?: A Little Help needed climbing 3-5 steps with a railing? : A Little 6 Click Score: 20    End of Session   Activity Tolerance: Patient tolerated treatment well;No increased pain;Patient limited by fatigue Patient left: in bed;with call bell/phone within reach;with family/visitor present Nurse Communication: Mobility status PT Visit Diagnosis: History of falling (Z91.81);Other symptoms and signs involving the nervous system (R29.898)     Time: 1022-1100 PT Time Calculation (min) (ACUTE ONLY): 38 min  Charges:    $Therapeutic Activity: 38-52 mins PT General Charges $$ ACUTE PT VISIT: 1 Visit                     Maylon Peppers, PT, DPT Physical Therapist - [facility]  [facility]    Samari Gorby A Harding Thomure 11/26/2023, 1:40 PM

## 2023-11-26 NOTE — Progress Notes (Signed)
 PROGRESS NOTE    Judy Jenkins  WUJ:[id number] DOB: 11/27/1934 DOA: 11/24/2023 PCP: [facility]    Brief Narrative:  89 y.o. female with medical history significant of orthostatic hypotension on fludrocortisone, breast cancer on hormone therapy, HTN, HLD, presented with recurrent falls and intracranial bleed.   Patient has a history of orthostatic hypotension, despite on fludrocortisone and compression stocking, symptoms are persistent.  For example, she mentioned that yesterday she checked her blood pressure while lying down SBP 170/60 however when she sits up and check her blood pressure again SBP was 70.  She has had the problem for at least 3 to 4 years and she learned to gradual and slow body position changes from lying down to sitting up and standing up.  However yesterday, she fell after she is standing up which she attributed " might have got up too fast" and hit her head on the back and loss of consciousness briefly.  ED workup showed small 5 mm SAH in the sylvian fissure.  And the patient was reassured and sent home from ED however while in the parking lot patient fell again when trying to enter a car.  And she had to double back to ER for further evaluation.  Neurosurgery recommended repeat CT this morning.  Currently patient has no complaints, denied any lightheadedness headache blurry vision nauseous vomiting.  No numbness weakness of any other limbs.  Denied any vertigo.   Assessment & Plan:   Principal Problem:   SAH (subarachnoid hemorrhage) (HCC) Active Problems:   Orthostatic hypotension   Recurrent syncope   Recurrent syncope Orthostatic hypotension Patient had several syncopal events after an ER visit after mechanical fall with small SDH.  Remains markedly orthostatic and symptomatic with over 40 point drop in systolic blood pressure upon standing.  Echocardiogram reassuring.  Patient remains orthostatic and symptomatic despite escalating doses of  midodrine. Plan: Resume Florinef at 0.1 mg daily Increase midodrine to 5 mg 3 times daily Bilateral TED hose Uptitrate to response Discharge pending improvement in symptoms   SAH, traumatic secondary to fall Scalp hematoma Repeat head CT reassuring Plan: Hold aspirin for 7 to 14 days Hold Plavix indefinitely   HTN - Continue metoprolol -Hold Lasix   DVT prophylaxis: SCD Code Status: Full Family Communication: Daughter at bedside 4/6, 4/7 Disposition Plan: Status is: Inpatient Remains inpatient appropriate because: Marked orthostatic hypotension     Level of care: Progressive  Consultants:  None  Procedures:  None  Antimicrobials: None    Subjective: Seen and examined.  Patient feels weak.  Objective: Vitals:   11/26/23 1433 11/26/23 1434 11/26/23 1435 11/26/23 1438  BP: (!) 131/56 (!) 94/59 (!) 67/41 (!) 131/56  Pulse:      Resp:      Temp:      TempSrc:      SpO2:      Weight:      Height:        Intake/Output Summary (Last 24 hours) at 11/26/2023 1455 Last data filed at 11/26/2023 0916 Gross per 24 hour  Intake 240 ml  Output 300 ml  Net -60 ml   Filed Weights   11/24/23 0513 11/24/23 0802  Weight: 54.4 kg 50.1 kg    Examination:  General exam: NAD.  Appears fatigued Respiratory system: Clear to auscultation. Respiratory effort normal. Cardiovascular system: S1-S2, RRR, no murmurs, no pedal edema Gastrointestinal system: Soft, NT/ND, normal bowel sounds Central nervous system: Alert and oriented. No focal neurological deficits. Extremities:  Symmetric 5 x 5 power. Skin: Posterior scalp hematoma, improving Psychiatry: Judgement and insight appear normal. Mood & affect appropriate.     Data Reviewed: I have personally reviewed following labs and imaging studies  CBC: Recent Labs  Lab 11/23/23 2138  WBC 8.8  NEUTROABS 5.8  HGB 11.5*  HCT 34.8*  MCV 93.3  PLT 241   Basic Metabolic Panel: Recent Labs  Lab 11/23/23 2138  NA  138  K 3.4*  CL 101  CO2 30  GLUCOSE 109*  BUN 17  CREATININE 0.98  CALCIUM 9.1   GFR: Estimated Creatinine Clearance: 32 mL/min (by C-G formula based on SCr of 0.98 mg/dL). Liver Function Tests: Recent Labs  Lab 11/23/23 2138  AST 32  ALT 26  ALKPHOS 53  BILITOT 0.6  PROT 6.3*  ALBUMIN 3.7   No results for input(s): "LIPASE", "AMYLASE" in the last 168 hours. No results for input(s): "AMMONIA" in the last 168 hours. Coagulation Profile: Recent Labs  Lab 11/23/23 2138  INR 1.0   Cardiac Enzymes: No results for input(s): "CKTOTAL", "CKMB", "CKMBINDEX", "TROPONINI" in the last 168 hours. BNP (last 3 results) No results for input(s): "PROBNP" in the last 8760 hours. HbA1C: No results for input(s): "HGBA1C" in the last 72 hours. CBG: Recent Labs  Lab 11/24/23 0807  GLUCAP 83   Lipid Profile: No results for input(s): "CHOL", "HDL", "LDLCALC", "TRIG", "CHOLHDL", "LDLDIRECT" in the last 72 hours. Thyroid Function Tests: No results for input(s): "TSH", "T4TOTAL", "FREET4", "T3FREE", "THYROIDAB" in the last 72 hours. Anemia Panel: No results for input(s): "VITAMINB12", "FOLATE", "FERRITIN", "TIBC", "IRON", "RETICCTPCT" in the last 72 hours. Sepsis Labs: No results for input(s): "PROCALCITON", "LATICACIDVEN" in the last 168 hours.  Recent Results (from the past 240 hours)  MRSA Next Gen by PCR, Nasal     Status: None   Collection Time: 11/24/23  8:05 AM   Specimen: Nasal Mucosa; Nasal Swab  Result Value Ref Range Status   MRSA by PCR Next Gen NOT DETECTED NOT DETECTED Final    Comment: (NOTE) The GeneXpert MRSA Assay (FDA approved for NASAL specimens only), is one component of a comprehensive MRSA colonization surveillance program. It is not intended to diagnose MRSA infection nor to guide or monitor treatment for MRSA infections. Test performance is not FDA approved in patients less than 40 years old. Performed at [facility], [address],  [place], [place] [address]          Radiology Studies: No results found.       Scheduled Meds:  amitriptyline  25 mg Oral QHS   Chlorhexidine Gluconate Cloth  6 each Topical Q0600   fludrocortisone  0.1 mg Oral Daily   letrozole  2.5 mg Oral Daily   levothyroxine  75 mcg Oral Q0600   metoprolol succinate  25 mg Oral BID   midodrine  5 mg Oral TID WC   pantoprazole  40 mg Oral Daily   rosuvastatin  10 mg Oral Daily   Continuous Infusions:   LOS: 1 day    Tresa Moore, MD [facility]   If 7PM-7AM, please contact night-coverage  11/26/2023, 2:55 PM

## 2023-11-27 DIAGNOSIS — I609 Nontraumatic subarachnoid hemorrhage, unspecified: Secondary | ICD-10-CM | POA: Diagnosis not present

## 2023-11-27 MED ORDER — MIDODRINE HCL 5 MG PO TABS
10.0000 mg | ORAL_TABLET | Freq: Three times a day (TID) | ORAL | Status: DC
  Administered 2023-11-27: 10 mg via ORAL
  Filled 2023-11-27: qty 2

## 2023-11-27 MED ORDER — FLUDROCORTISONE ACETATE 0.1 MG PO TABS: 0.1000 mg | ORAL_TABLET | Freq: Every day | ORAL | 0 refills | Status: AC

## 2023-11-27 MED ORDER — MIDODRINE HCL 10 MG PO TABS: 10.0000 mg | ORAL_TABLET | Freq: Three times a day (TID) | ORAL | 0 refills | Status: AC

## 2023-11-27 NOTE — Plan of Care (Signed)
 ?  Problem: Activity: ?Goal: Risk for activity intolerance will decrease ?Outcome: Progressing ?  ?Problem: Nutrition: ?Goal: Adequate nutrition will be maintained ?Outcome: Progressing ?  ?Problem: Elimination: ?Goal: Will not experience complications related to urinary retention ?Outcome: Progressing ?  ?Problem: Safety: ?Goal: Ability to remain free from injury will improve ?Outcome: Progressing ?  ?

## 2023-11-27 NOTE — Progress Notes (Signed)
 Physical Therapy Treatment Patient Details Name: Judy Jenkins MRN: [id number] DOB: 1934-11-28 Today's Date: 11/27/2023   History of Present Illness Judy Jenkins is an 89yoF who comes to [facility] 11/24/23 after backwards mechanical fall, hit impact on ramp. PMH: orthostatic hypotension on fludrocortisone, BrCA, HTN, HLD, previous falls. ED workup showed small 5 mm SAH in the sylvian fissure.  And the patient was reassured and sent home from ED however while in the parking lot patient fell again when trying to enter a car.  And she had to double back to ER for further evaluation.  Neurosurgery recommended repeat CT this morning. MD added midodrine 2.5mg  TID and ordered PT evaluation.    PT Comments  Patient continues to progress although limited by orthostatic hypotension. Ambulatory in the room with no AD for multiple bouts but experiencing her wave of "weakness and numbness" requiring seated rest break. Educated patient on use of rollator and safety precautions, patient demonstrated understanding. Discharge plan remains appropriate.    Orthostatic BPs  Sitting 103/54  Standing 76/45  Standing after 3 min 74/44  Sitting after 40' ambulation -symptomatic * 85/45  Sitting after 3 mins 105/52  Sitting after 40' ambulation - symptomatic * 93/55  Standing after 25' ambulation - symptomatic  75/44     If plan is discharge home, recommend the following: Direct supervision/assist for financial management;Assist for transportation;Help with stairs or ramp for entrance   Can travel by private vehicle        Equipment Recommendations  Rollator (4 wheels)    Recommendations for Other Services       Precautions / Restrictions Precautions Precautions: Fall Recall of Precautions/Restrictions: Intact Restrictions Weight Bearing Restrictions Per Provider Order: No     Mobility  Bed Mobility Overal bed mobility: Modified Independent                  Transfers Overall transfer  level: Modified independent Equipment used: None                    Ambulation/Gait Ambulation/Gait assistance: Contact guard assist Gait Distance (Feet): 40 Feet (+40', +20') Assistive device: None, Rollator (4 wheels) Gait Pattern/deviations: Step-through pattern, Decreased stride length Gait velocity: decreased         Stairs             Wheelchair Mobility     Tilt Bed    Modified Rankin (Stroke Patients Only)       Balance Overall balance assessment: Needs assistance Sitting-balance support: Feet supported Sitting balance-Leahy Scale: Normal     Standing balance support: During functional activity, Reliant on assistive device for balance, No upper extremity supported Standing balance-Leahy Scale: Good                              Communication Communication Communication: No apparent difficulties  Cognition Arousal: Alert Behavior During Therapy: WFL for tasks assessed/performed   PT - Cognitive impairments: No apparent impairments                         Following commands: Intact      Cueing Cueing Techniques: Verbal cues  Exercises      General Comments        Pertinent Vitals/Pain Pain Assessment Pain Assessment: No/denies pain    Home Living  Prior Function            PT Goals (current goals can now be found in the care plan section) Acute Rehab PT Goals Patient Stated Goal: prevent additional falls/syncope. PT Goal Formulation: With patient/family Time For Goal Achievement: 12/09/23 Potential to Achieve Goals: Fair Progress towards PT goals: Progressing toward goals    Frequency    Min 3X/week      PT Plan      Co-evaluation              AM-PAC PT "6 Clicks" Mobility   Outcome Measure  Help needed turning from your back to your side while in a flat bed without using bedrails?: None Help needed moving from lying on your back to sitting on  the side of a flat bed without using bedrails?: None Help needed moving to and from a bed to a chair (including a wheelchair)?: A Little Help needed standing up from a chair using your arms (e.g., wheelchair or bedside chair)?: A Little Help needed to walk in hospital room?: A Little Help needed climbing 3-5 steps with a railing? : A Little 6 Click Score: 20    End of Session   Activity Tolerance: Patient tolerated treatment well;No increased pain;Patient limited by fatigue Patient left: in bed;with call bell/phone within reach;with family/visitor present Nurse Communication: Mobility status PT Visit Diagnosis: History of falling (Z91.81);Other symptoms and signs involving the nervous system (R29.898)     Time: 1010-1034 PT Time Calculation (min) (ACUTE ONLY): 24 min  Charges:    $Therapeutic Activity: 23-37 mins PT General Charges $$ ACUTE PT VISIT: 1 Visit                     Maylon Peppers, PT, DPT Physical Therapist - [facility]  [facility]    Sota Hetz A Haroun Cotham 11/27/2023, 12:26 PM

## 2023-11-27 NOTE — Discharge Summary (Signed)
 Physician Discharge Summary  Judy Jenkins XBJ:[id number] DOB: 12-03-1934 DOA: 11/24/2023  PCP: [facility]  Admit date: 11/24/2023 Discharge date: 11/27/2023  Admitted From: Home Disposition:  Home with   Recommendations for Outpatient Follow-up:  Follow up with PCP in 1-2 weeks   Home Health:None  Equipment/Devices:4 wheel walker   Discharge Condition:Stable  CODE STATUS:Full  Diet recommendation: Reg  Brief/Interim Summary:  89 y.o. female with medical history significant of orthostatic hypotension on fludrocortisone, breast cancer on hormone therapy, HTN, HLD, presented with recurrent falls and intracranial bleed.   Patient has a history of orthostatic hypotension, despite on fludrocortisone and compression stocking, symptoms are persistent.  For example, she mentioned that yesterday she checked her blood pressure while lying down SBP 170/60 however when she sits up and check her blood pressure again SBP was 70.  She has had the problem for at least 3 to 4 years and she learned to gradual and slow body position changes from lying down to sitting up and standing up.  However yesterday, she fell after she is standing up which she attributed " might have got up too fast" and hit her head on the back and loss of consciousness briefly.  ED workup showed small 5 mm SAH in the sylvian fissure.  And the patient was reassured and sent home from ED however while in the parking lot patient fell again when trying to enter a car.  And she had to double back to ER for further evaluation.  Neurosurgery recommended repeat CT this morning.  Currently patient has no complaints, denied any lightheadedness headache blurry vision nauseous vomiting.  No numbness weakness of any other limbs.  Denied any vertigo.    Discharge Diagnoses:  Principal Problem:   SAH (subarachnoid hemorrhage) (HCC) Active Problems:   Orthostatic hypotension   Recurrent syncope   Recurrent  syncope Orthostatic hypotension Patient had several syncopal events after an ER visit after mechanical fall with small SDH.  Remains markedly orthostatic and symptomatic with over 40 point drop in systolic blood pressure upon standing.  Echocardiogram reassuring.  Patient remains orthostatic and symptomatic despite escalating doses of midodrine. Improved after near max doses of Florinef and midodrine Plan: Discharge home.  Florinef 0.1 mg daily.  Midodrine 10 mg 3 times daily.  Will need close follow-up with primary care ideally within 1 to 2 weeks of discharge to discuss continuing need for these medications and potential dose titrations    SAH, traumatic secondary to fall Scalp hematoma Repeat head CT reassuring When evaluated discharge.  No active bleeding noted Plan: Hold aspirin for 7 to 14 days.  Can restart 4/15 Hold Plavix indefinitely until discussion with PCP   HTN - Continue metoprolol -Hold Lasix  Discharge Instructions  Discharge Instructions     Diet - low sodium heart healthy   Complete by: As directed    Increase activity slowly   Complete by: As directed       Allergies as of 11/27/2023       Reactions   Avelox [moxifloxacin Hcl In Nacl] Hives, Nausea And Vomiting   Ciprofloxacin Hives   Biaxin [clarithromycin] Hives, Nausea And Vomiting   Atorvastatin Other (See Comments)   Myalgia   Codeine Other (See Comments)   Headaches   Sulfa Antibiotics Rash        Medication List     PAUSE taking these medications    clopidogrel 75 MG tablet Wait to take this until your doctor or other care  provider tells you to start again. Commonly known as: PLAVIX Take 75 mg by mouth daily.       STOP taking these medications    cephALEXin 500 MG capsule Commonly known as: KEFLEX   furosemide 20 MG tablet Commonly known as: LASIX   Paxlovid (150/100) 10 x 150 MG & 10 x 100MG  Tbpk Generic drug: nirmatrelvir/ritonavir (renal dosing)       TAKE these  medications    amitriptyline 10 MG tablet Commonly known as: ELAVIL Take 10 mg by mouth at bedtime.   aspirin EC 81 MG tablet Take 1 tablet (81 mg total) by mouth daily.   Biotin 1 MG Caps Take by mouth.   Cholecalciferol 25 MCG (1000 UT) capsule Take 1,000 Units by mouth daily.   fludrocortisone 0.1 MG tablet Commonly known as: FLORINEF Take 1 tablet (0.1 mg total) by mouth daily. Start taking on: November 28, 2023 What changed: how much to take   ipratropium 0.06 % nasal spray Commonly known as: ATROVENT Place 2 sprays into both nostrils 4 (four) times daily.   letrozole 2.5 MG tablet Commonly known as: FEMARA Take 2.5 mg by mouth daily.   levothyroxine 75 MCG tablet Commonly known as: SYNTHROID Take 75 mcg by mouth daily.   lidocaine 2 % solution Commonly known as: XYLOCAINE Use as directed 15 mLs in the mouth or throat every 3 (three) hours as needed for mouth pain (swish and spit).   metoprolol succinate 25 MG 24 hr tablet Commonly known as: TOPROL-XL Take 25 mg by mouth daily.   midodrine 10 MG tablet Commonly known as: PROAMATINE Take 1 tablet (10 mg total) by mouth 3 (three) times daily with meals.   omeprazole 20 MG tablet Commonly known as: PRILOSEC OTC Prilosec 20 mg capsule,delayed release  Take 1 capsule every day by oral route.   rosuvastatin 10 MG tablet Commonly known as: CRESTOR Take 1 tablet (10 mg total) by mouth daily.   Vitamin E 268 MG (400 UNIT) Caps Take 400 Units by mouth daily.               Durable Medical Equipment  (From admission, onward)           Start     Ordered   11/26/23 1548  For home use only DME 4 wheeled rolling walker with seat  Once       Question:  Patient needs a walker to treat with the following condition  Answer:  Orthostatic hypotension   11/26/23 1547            Allergies  Allergen Reactions   Avelox [Moxifloxacin Hcl In Nacl] Hives and Nausea And Vomiting   Ciprofloxacin Hives   Biaxin  [Clarithromycin] Hives and Nausea And Vomiting   Atorvastatin Other (See Comments)    Myalgia    Codeine Other (See Comments)    Headaches   Sulfa Antibiotics Rash    Consultations: None   Procedures/Studies: ECHOCARDIOGRAM COMPLETE Result Date: 11/24/2023    ECHOCARDIOGRAM REPORT   Patient Name:   Judy Jenkins Date of Exam: 11/24/2023 Medical Rec #:  [id number]            Height:       67.0 in Accession #:    [id number]           Weight:       110.4 lb Date of Birth:  15-Mar-1935             BSA:  1.571 m Patient Age:    89 years             BP:           144/73 mmHg Patient Gender: F                    HR:           86 bpm. Exam Location:  [facility] Procedure: 2D Echo, Cardiac Doppler and Color Doppler (Both Spectral and Color            Flow Doppler were utilized during procedure). Indications:     Syncope R55  History:         Patient has prior history of Echocardiogram examinations, most                  recent 02/13/2022.  Sonographer:     Overton Mam RDCS, FASE Referring Phys:  [id number] [place] General Diagnosing Phys: Alwyn Pea MD IMPRESSIONS  1. Left ventricular ejection fraction, by estimation, is 65 to 70%. The left ventricle has normal function. The left ventricle has no regional wall motion abnormalities. Left ventricular diastolic parameters are consistent with Grade I diastolic dysfunction (impaired relaxation).  2. Right ventricular systolic function is normal. The right ventricular size is normal.  3. The mitral valve is normal in structure. No evidence of mitral valve regurgitation.  4. The aortic valve is normal in structure. Aortic valve regurgitation is not visualized.  5. Aortic dilatation noted. FINDINGS  Left Ventricle: Left ventricular ejection fraction, by estimation, is 65 to 70%. The left ventricle has normal function. The left ventricle has no regional wall motion abnormalities. Strain was performed and the global longitudinal strain is indeterminate. Global  longitudinal strain performed but not reported based on interpreter judgement due to suboptimal tracking. The left ventricular internal cavity size was normal in size. There is no left ventricular hypertrophy. Left ventricular diastolic parameters are consistent with Grade I diastolic dysfunction (impaired relaxation). Right Ventricle: The right ventricular size is normal. No increase in right ventricular wall thickness. Right ventricular systolic function is normal. Left Atrium: Left atrial size was normal in size. Right Atrium: Right atrial size was normal in size. Pericardium: There is no evidence of pericardial effusion. Mitral Valve: The mitral valve is normal in structure. No evidence of mitral valve regurgitation. Tricuspid Valve: The tricuspid valve is normal in structure. Tricuspid valve regurgitation is not demonstrated. Aortic Valve: The aortic valve is normal in structure. Aortic valve regurgitation is not visualized. Aortic valve peak gradient measures 5.2 mmHg. Pulmonic Valve: The pulmonic valve was normal in structure. Pulmonic valve regurgitation is not visualized. Aorta: The ascending aorta was not well visualized and aortic dilatation noted. IAS/Shunts: No atrial level shunt detected by color flow Doppler. Additional Comments: 3D was performed not requiring image post processing on an independent workstation and was indeterminate.  LEFT VENTRICLE PLAX 2D LVIDd:         3.20 cm   Diastology LVIDs:         2.10 cm   LV e' medial:    5.55 cm/s LV PW:         1.10 cm   LV E/e' medial:  10.7 LV IVS:        0.80 cm   LV e' lateral:   6.53 cm/s LVOT diam:     1.90 cm   LV E/e' lateral: 9.1 LV SV:         46  LV SV Index:   29 LVOT Area:     2.84 cm  RIGHT VENTRICLE RV Basal diam:  3.20 cm RV S prime:     19.90 cm/s TAPSE (M-mode): 2.3 cm LEFT ATRIUM             Index       RIGHT ATRIUM           Index LA diam:        1.60 cm 1.02 cm/m  RA Area:     10.59 cm LA Vol (A2C):   12.4 ml 7.89 ml/m  RA Volume:    24.55 ml  15.62 ml/m LA Vol (A4C):   9.1 ml  5.77 ml/m LA Biplane Vol: 11.4 ml 7.26 ml/m  AORTIC VALVE                 PULMONIC VALVE AV Area (Vmax): 2.34 cm     PV Vmax:        0.91 m/s AV Vmax:        114.00 cm/s  PV Peak grad:   3.3 mmHg AV Peak Grad:   5.2 mmHg     RVOT Peak grad: 3 mmHg LVOT Vmax:      94.20 cm/s LVOT Vmean:     70.300 cm/s LVOT VTI:       0.161 m  AORTA Ao Root diam: 3.30 cm Ao Asc diam:  3.20 cm MITRAL VALVE                TRICUSPID VALVE MV Area (PHT): 3.63 cm     TR Peak grad:   31.6 mmHg MV Decel Time: 209 msec     TR Vmax:        281.00 cm/s MV E velocity: 59.20 cm/s MV A velocity: 106.00 cm/s  SHUNTS MV E/A ratio:  0.56         Systemic VTI:  0.16 m                             Systemic Diam: 1.90 cm Alwyn Pea MD Electronically signed by Alwyn Pea MD Signature Date/Time: 11/24/2023/3:36:46 PM    Final    CT Head Wo Contrast Result Date: 11/24/2023 CLINICAL DATA:  89 year old female status post fall backwards, small volume subarachnoid hemorrhage. EXAM: CT HEAD WITHOUT CONTRAST TECHNIQUE: Contiguous axial images were obtained from the base of the skull through the vertex without intravenous contrast. RADIATION DOSE REDUCTION: This exam was performed according to the departmental dose-optimization program which includes automated exposure control, adjustment of the mA and/or kV according to patient size and/or use of iterative reconstruction technique. COMPARISON:  Head CT 0229 hours today and earlier. FINDINGS: Brain: Trace suspected subarachnoid hemorrhage within the sulcus at the right inferior frontal gyrus series 2, image 14 appears unchanged since presentation yesterday. No IVH or ventriculomegaly. Cavum septum lucent, normal variant. No intracranial mass effect or midline shift. Stable gray-white matter differentiation throughout the brain. No new intracranial hemorrhage identified. Vascular: No suspicious intracranial vascular hyperdensity. Calcified  atherosclerosis at the skull base. Skull: Osteopenia.  Stable. Sinuses/Orbits: Low-density fluid or mucosal thickening in the left maxillary sinus appears inflammatory, with previous sinus surgery on that side. Stable sinus aeration. Tympanic cavities and mastoids well aerated. Other: Broad-based posterior scalp hematoma along the left convexity has progressed since yesterday. Evidence of associated skin laceration. Underlying calvarium there appears stable and intact. Negative orbits soft tissues. IMPRESSION: 1. Trace  SAH at the right frontal gyrus stable since yesterday. No new intracranial abnormality. 2. Progressed Left posterior scalp hematoma, no skull fracture identified. Electronically Signed   By: Odessa Fleming M.D.   On: 11/24/2023 09:47   CT Head Wo Contrast Result Date: 11/24/2023 CLINICAL DATA:  Status post trauma, follow-up subarachnoid hemorrhage. EXAM: CT HEAD WITHOUT CONTRAST TECHNIQUE: Contiguous axial images were obtained from the base of the skull through the vertex without intravenous contrast. RADIATION DOSE REDUCTION: This exam was performed according to the departmental dose-optimization program which includes automated exposure control, adjustment of the mA and/or kV according to patient size and/or use of iterative reconstruction technique. COMPARISON:  November 23, 2023 FINDINGS: Brain: There is generalized cerebral atrophy with widening of the extra-axial spaces and ventricular dilatation. There are areas of decreased attenuation within the white matter tracts of the supratentorial brain, consistent with microvascular disease changes. A predominantly stable 5 mm hyperdense area is seen within the anterior aspect of the sylvian fissure on the right. This is seen on the prior study. There is no evidence of associated mass effect or midline shift. Cavum septum pellucidum is noted. Vascular: No hyperdense vessel or unexpected calcification. Skull: Normal. Negative for fracture or focal lesion.  Sinuses/Orbits: No acute finding. Other: Right posterior parietal scalp soft tissue swelling is noted. IMPRESSION: 1. Predominantly stable 5 mm hyperdense area within the anterior aspect of the Sylvian fissure on the right, consistent with a small amount of subarachnoid hemorrhage. 2. Generalized cerebral atrophy and microvascular disease changes of the supratentorial brain. 3. Right posterior parietal scalp soft tissue swelling. Electronically Signed   By: Aram Candela M.D.   On: 11/24/2023 02:46   CT ANGIO HEAD NECK W WO CM Result Date: 11/23/2023 CLINICAL DATA:  Fall.  On anticoagulation. EXAM: CT ANGIOGRAPHY HEAD AND NECK WITH AND WITHOUT CONTRAST TECHNIQUE: Multidetector CT imaging of the head and neck was performed using the standard protocol during bolus administration of intravenous contrast. Multiplanar CT image reconstructions and MIPs were obtained to evaluate the vascular anatomy. Carotid stenosis measurements (when applicable) are obtained utilizing NASCET criteria, using the distal internal carotid diameter as the denominator. RADIATION DOSE REDUCTION: This exam was performed according to the departmental dose-optimization program which includes automated exposure control, adjustment of the mA and/or kV according to patient size and/or use of iterative reconstruction technique. CONTRAST:  75mL OMNIPAQUE IOHEXOL 350 MG/ML SOLN COMPARISON:  Head CT 11/23/2023 and CTA head neck 02/12/2022 FINDINGS: CTA NECK FINDINGS Skeleton: No acute abnormality or high grade bony spinal canal stenosis. Other neck: Normal pharynx, larynx and major salivary glands. No cervical lymphadenopathy. Unremarkable thyroid gland. Upper chest: No pneumothorax or pleural effusion. No nodules or masses. Aortic arch: There is calcific atherosclerosis of the aortic arch. Conventional 3 vessel aortic branching pattern. RIGHT carotid system: No dissection, occlusion or aneurysm. Mild atherosclerotic calcification at the carotid  bifurcation without hemodynamically significant stenosis. LEFT carotid system: No dissection, occlusion or aneurysm. Mild atherosclerotic calcification at the carotid bifurcation without hemodynamically significant stenosis. Vertebral arteries: Left dominant configuration. There is no dissection, occlusion or flow-limiting stenosis to the skull base (V1-V3 segments). CTA HEAD FINDINGS POSTERIOR CIRCULATION: Vertebral arteries are normal. No proximal occlusion of the anterior or inferior cerebellar arteries. Basilar artery is normal. Superior cerebellar arteries are normal. Posterior cerebral arteries are normal. ANTERIOR CIRCULATION: Intracranial internal carotid arteries are normal. Anterior cerebral arteries are normal. Middle cerebral arteries are normal. Venous sinuses: As permitted by contrast timing, patent. Anatomic variants: None Review of  the MIP images confirms the above findings. IMPRESSION: 1. No emergent large vessel occlusion or hemodynamically significant stenosis of the head or neck. 2. Mild bilateral carotid bifurcation atherosclerosis without hemodynamically significant stenosis. Aortic atherosclerosis (ICD10-I70.0). Electronically Signed   By: Deatra Robinson M.D.   On: 11/23/2023 22:59   CT Cervical Spine Wo Contrast Result Date: 11/23/2023 CLINICAL DATA:  Fall. EXAM: CT CERVICAL SPINE WITHOUT CONTRAST TECHNIQUE: Multidetector CT imaging of the cervical spine was performed without intravenous contrast. Multiplanar CT image reconstructions were also generated. RADIATION DOSE REDUCTION: This exam was performed according to the departmental dose-optimization program which includes automated exposure control, adjustment of the mA and/or kV according to patient size and/or use of iterative reconstruction technique. COMPARISON:  None Available. FINDINGS: Alignment: Grade 1 degenerative anterolisthesis is present at C4-5 and C5-6. No other significant listhesis is present. Cervical lordosis is  preserved. Skull base and vertebrae: Craniocervical junction is within normal limits. Degenerative changes are present C1-2. Vertebral body heights are normal. No acute fractures are present. Soft tissues and spinal canal: No prevertebral fluid or swelling. No visible canal hematoma. Atherosclerotic calcifications are present at the carotid bifurcations bilaterally. No significant stenosis is present. Disc levels: Uncovertebral and facet hypertrophy results in severe foraminal stenosis bilaterally at C3-4, moderate foraminal narrowing is worse on the right at C4-5. Moderate foraminal narrowing is present bilaterally at C5-6 and C6-7. Asymmetric left-sided uncovertebral spurring leads to moderate left foraminal stenosis at C7-T1 Upper chest: Chronic scarring is present at the right lung apex. IMPRESSION: 1. No acute fracture or traumatic subluxation. 2. Multilevel degenerative changes of the cervical spine as described. 3. Severe foraminal stenosis bilaterally at C3-4, moderate foraminal narrowing is worse on the right at C4-5. 4. Moderate foraminal narrowing bilaterally at C5-6 and C6-7. 5. Moderate left foraminal stenosis at C7-T1. Electronically Signed   By: Marin Roberts M.D.   On: 11/23/2023 19:39   CT Head Wo Contrast Result Date: 11/23/2023 CLINICAL DATA:  Head trauma, minor. Patient fell backward in hit head on wooden ramp. EXAM: CT HEAD WITHOUT CONTRAST TECHNIQUE: Contiguous axial images were obtained from the base of the skull through the vertex without intravenous contrast. RADIATION DOSE REDUCTION: This exam was performed according to the departmental dose-optimization program which includes automated exposure control, adjustment of the mA and/or kV according to patient size and/or use of iterative reconstruction technique. COMPARISON:  CT head without contrast 02/12/2022 FINDINGS: Brain: Hyperdense focus in the anterior right sylvian fissure is consistent with subarachnoid hemorrhage. No other  focal hemorrhage is present. Moderate generalized atrophy and diffuse white matter disease is otherwise stable. Cavum septum flu syndrome is noted. Ventricles are proportionate to the degree of atrophy. Deep brain nuclei are within normal limits. No other significant extra-axial fluid collection is present. The brainstem and cerebellum are within normal limits. Midline structures are within normal limits. Vascular: Atherosclerotic calcifications are present within the cavernous internal carotid arteries and at the dural margin of the left vertebral artery. No hyperdense vessel is present. Skull: Calvarium is intact. A posterior right parietal scalp laceration and hematoma is present. No underlying fracture is present. No radiopaque foreign body is present. Sinuses/Orbits: The paranasal sinuses and mastoid air cells are clear. Bilateral lens replacements are noted. Globes and orbits are otherwise unremarkable. IMPRESSION: 1. Hyperdense focus in the anterior right Sylvian fissure is consistent with subarachnoid hemorrhage. 2. Posterior right parietal scalp laceration and hematoma without underlying fracture. 3. Stable atrophy and white matter disease. This likely reflects the sequela  of chronic microvascular ischemia. Critical Value/emergent results were called by telephone at the time of interpretation on 11/23/2023 at 7:34 pm to provider Willy Eddy , who verbally acknowledged these results. Electronically Signed   By: Marin Roberts M.D.   On: 11/23/2023 19:34      Subjective: Seen and examined on the day of discharge.  Stable no distress.  Appropriate for discharge home.  Discharge Exam: Vitals:   11/27/23 1352 11/27/23 1355  BP: (!) 115/57 (!) 84/52  Pulse: 75 83  Resp:    Temp:    SpO2:     Vitals:   11/27/23 1013 11/27/23 1348 11/27/23 1352 11/27/23 1355  BP: (!) 76/45 132/60 (!) 115/57 (!) 84/52  Pulse: 82 73 75 83  Resp: 18     Temp:      TempSrc:      SpO2: 99%     Weight:       Height:        General: Pt is alert, awake, not in acute distress Cardiovascular: RRR, S1/S2 +, no rubs, no gallops Respiratory: CTA bilaterally, no wheezing, no rhonchi Abdominal: Soft, NT, ND, bowel sounds + Extremities: no edema, no cyanosis    The results of significant diagnostics from this hospitalization (including imaging, microbiology, ancillary and laboratory) are listed below for reference.     Microbiology: Recent Results (from the past 240 hours)  MRSA Next Gen by PCR, Nasal     Status: None   Collection Time: 11/24/23  8:05 AM   Specimen: Nasal Mucosa; Nasal Swab  Result Value Ref Range Status   MRSA by PCR Next Gen NOT DETECTED NOT DETECTED Final    Comment: (NOTE) The GeneXpert MRSA Assay (FDA approved for NASAL specimens only), is one component of a comprehensive MRSA colonization surveillance program. It is not intended to diagnose MRSA infection nor to guide or monitor treatment for MRSA infections. Test performance is not FDA approved in patients less than 7 years old. Performed at [facility], [address] [place], [place] [address]      Labs: BNP (last 3 results) No results for input(s): "BNP" in the last 8760 hours. Basic Metabolic Panel: Recent Labs  Lab 11/23/23 2138  NA 138  K 3.4*  CL 101  CO2 30  GLUCOSE 109*  BUN 17  CREATININE 0.98  CALCIUM 9.1   Liver Function Tests: Recent Labs  Lab 11/23/23 2138  AST 32  ALT 26  ALKPHOS 53  BILITOT 0.6  PROT 6.3*  ALBUMIN 3.7   No results for input(s): "LIPASE", "AMYLASE" in the last 168 hours. No results for input(s): "AMMONIA" in the last 168 hours. CBC: Recent Labs  Lab 11/23/23 2138  WBC 8.8  NEUTROABS 5.8  HGB 11.5*  HCT 34.8*  MCV 93.3  PLT 241   Cardiac Enzymes: No results for input(s): "CKTOTAL", "CKMB", "CKMBINDEX", "TROPONINI" in the last 168 hours. BNP: Invalid input(s): "POCBNP" CBG: Recent Labs  Lab 11/24/23 0807  GLUCAP 83   D-Dimer No  results for input(s): "DDIMER" in the last 72 hours. Hgb A1c No results for input(s): "HGBA1C" in the last 72 hours. Lipid Profile No results for input(s): "CHOL", "HDL", "LDLCALC", "TRIG", "CHOLHDL", "LDLDIRECT" in the last 72 hours. Thyroid function studies No results for input(s): "TSH", "T4TOTAL", "T3FREE", "THYROIDAB" in the last 72 hours.  Invalid input(s): "FREET3" Anemia work up No results for input(s): "VITAMINB12", "FOLATE", "FERRITIN", "TIBC", "IRON", "RETICCTPCT" in the last 72 hours. Urinalysis    Component Value Date/Time  COLORURINE STRAW (A) 02/12/2022 1749   APPEARANCEUR CLEAR (A) 02/12/2022 1749   LABSPEC 1.010 02/12/2022 1749   PHURINE 8.0 02/12/2022 1749   GLUCOSEU NEGATIVE 02/12/2022 1749   HGBUR NEGATIVE 02/12/2022 1749   BILIRUBINUR NEGATIVE 02/12/2022 1749   KETONESUR 5 (A) 02/12/2022 1749   PROTEINUR NEGATIVE 02/12/2022 1749   NITRITE NEGATIVE 02/12/2022 1749   LEUKOCYTESUR NEGATIVE 02/12/2022 1749   Sepsis Labs Recent Labs  Lab 11/23/23 2138  WBC 8.8   Microbiology Recent Results (from the past 240 hours)  MRSA Next Gen by PCR, Nasal     Status: None   Collection Time: 11/24/23  8:05 AM   Specimen: Nasal Mucosa; Nasal Swab  Result Value Ref Range Status   MRSA by PCR Next Gen NOT DETECTED NOT DETECTED Final    Comment: (NOTE) The GeneXpert MRSA Assay (FDA approved for NASAL specimens only), is one component of a comprehensive MRSA colonization surveillance program. It is not intended to diagnose MRSA infection nor to guide or monitor treatment for MRSA infections. Test performance is not FDA approved in patients less than 9 years old. Performed at [facility], [address] [place], [place] [address]      Time coordinating discharge: Over 30 minutes  SIGNED:   Tresa Moore, MD  [facility] 11/27/2023, 2:38 PM Pager   If 7PM-7AM, please contact night-coverage

## 2024-04-15 ENCOUNTER — Telehealth (INDEPENDENT_AMBULATORY_CARE_PROVIDER_SITE_OTHER): Payer: Self-pay | Admitting: Vascular Surgery

## 2024-04-15 NOTE — Telephone Encounter (Signed)
 She can come in with bilateral reflux studies and see JD

## 2024-04-15 NOTE — Telephone Encounter (Signed)
 Patient is scheduled

## 2024-04-15 NOTE — Telephone Encounter (Signed)
 Right leg having little places on leg that are swollen ( 3) . Saw orthopedics and they think vascular in nature - wants her to see Dr. Marea. Please advise.

## 2024-04-18 ENCOUNTER — Encounter (INDEPENDENT_AMBULATORY_CARE_PROVIDER_SITE_OTHER): Payer: Self-pay | Admitting: Vascular Surgery

## 2024-04-18 ENCOUNTER — Other Ambulatory Visit (INDEPENDENT_AMBULATORY_CARE_PROVIDER_SITE_OTHER)

## 2024-04-18 ENCOUNTER — Ambulatory Visit (INDEPENDENT_AMBULATORY_CARE_PROVIDER_SITE_OTHER): Admitting: Vascular Surgery

## 2024-04-18 ENCOUNTER — Other Ambulatory Visit (INDEPENDENT_AMBULATORY_CARE_PROVIDER_SITE_OTHER): Payer: Self-pay | Admitting: Internal Medicine

## 2024-04-18 VITALS — BP 144/90 | HR 79 | Wt 119.2 lb

## 2024-04-18 DIAGNOSIS — E785 Hyperlipidemia, unspecified: Secondary | ICD-10-CM

## 2024-04-18 DIAGNOSIS — I83891 Varicose veins of right lower extremities with other complications: Secondary | ICD-10-CM | POA: Diagnosis not present

## 2024-04-18 DIAGNOSIS — M7989 Other specified soft tissue disorders: Secondary | ICD-10-CM

## 2024-04-18 NOTE — Progress Notes (Signed)
 MRN : [id number]  Judy Jenkins is a 89 y.o. (May 09, 1935) female who presents with chief complaint of  Chief Complaint  Patient presents with   Follow-up  .  History of Present Illness: Patient returns today in follow up of pain and swelling in her right lower extremity.  She was last seen in the office about 2 years ago.  She has had intermittent flares where the swelling becomes much more significant.  This is associated with some discoloration as well as some increased prominence of the varicosities.  Intermittent use of compression socks has helped the swelling when worn.  She also elevates her legs.  She denies any open wounds or ulcers.  No fevers or chills.  No chest pain or shortness of breath.  A venous reflux study was performed demonstrating no evidence of DVT or superficial thrombophlebitis.  No significant venous reflux is seen in the right lower extremity  Current Outpatient Medications  Medication Sig Dispense Refill   amitriptyline  (ELAVIL ) 10 MG tablet Take 10 mg by mouth at bedtime.     aspirin  EC 81 MG tablet Take 1 tablet (81 mg total) by mouth daily. 30 tablet 0   Biotin 1 MG CAPS Take by mouth.     Cholecalciferol 25 MCG (1000 UT) capsule Take 1,000 Units by mouth daily.     [Paused] clopidogrel  (PLAVIX ) 75 MG tablet Take 75 mg by mouth daily.     ipratropium (ATROVENT ) 0.06 % nasal spray Place 2 sprays into both nostrils 4 (four) times daily. 15 mL 0   letrozole  (FEMARA ) 2.5 MG tablet Take 2.5 mg by mouth daily.     levothyroxine  (SYNTHROID ) 75 MCG tablet Take 75 mcg by mouth daily.     lidocaine  (XYLOCAINE ) 2 % solution Use as directed 15 mLs in the mouth or throat every 3 (three) hours as needed for mouth pain (swish and spit). 100 mL 0   metoprolol  succinate (TOPROL -XL) 25 MG 24 hr tablet Take 25 mg by mouth daily.     omeprazole  (PRILOSEC  OTC) 20 MG tablet Prilosec  20 mg capsule,delayed release  Take 1 capsule every day by oral route.     rosuvastatin   (CRESTOR ) 10 MG tablet Take 1 tablet (10 mg total) by mouth daily. 30 tablet 0   Vitamin E 268 MG (400 UNIT) CAPS Take 400 Units by mouth daily.     No current facility-administered medications for this visit.    Past Medical History:  Diagnosis Date   Cancer (HCC)    Tachycardia     Past Surgical History:  Procedure Laterality Date   ABDOMINAL HYSTERECTOMY     BREAST SURGERY     CHOLECYSTECTOMY       Social History   Tobacco Use   Smoking status: Never   Smokeless tobacco: Never  Vaping Use   Vaping status: Never Used  Substance Use Topics   Alcohol use: No   Drug use: No       Family History  Problem Relation Age of Onset   CAD Mother    CAD Father   No bleeding or clotting disorders  Allergies  Allergen Reactions   Avelox [Moxifloxacin Hcl In Nacl] Hives and Nausea And Vomiting   Ciprofloxacin Hives   Biaxin [Clarithromycin] Hives and Nausea And Vomiting   Atorvastatin  Other (See Comments)    Myalgia    Codeine Other (See Comments)    Headaches   Sulfa Antibiotics Rash     REVIEW OF SYSTEMS (Negative  unless checked)  Constitutional: [x] Weight loss  [] Fever  [] Chills Cardiac: [] Chest pain   [] Chest pressure   [x] Palpitations   [] Shortness of breath when laying flat   [] Shortness of breath at rest   [] Shortness of breath with exertion. Vascular:  [x] Pain in legs with walking   [x] Pain in legs at rest   [] Pain in legs when laying flat   [] Claudication   [] Pain in feet when walking  [] Pain in feet at rest  [] Pain in feet when laying flat   [] History of DVT   [] Phlebitis   [x] Swelling in legs   [x] Varicose veins   [] Non-healing ulcers Pulmonary:   [] Uses home oxygen   [] Productive cough   [] Hemoptysis   [] Wheeze  [] COPD   [] Asthma Neurologic:  [] Dizziness  [] Blackouts   [] Seizures   [] History of stroke   [] History of TIA  [] Aphasia   [] Temporary blindness   [] Dysphagia   [] Weakness or numbness in arms   [] Weakness or numbness in legs Musculoskeletal:   [] Arthritis   [] Joint swelling   [] Joint pain   [] Low back pain Hematologic:  [] Easy bruising  [] Easy bleeding   [] Hypercoagulable state   [] Anemic   Gastrointestinal:  [] Blood in stool   [] Vomiting blood  [] Gastroesophageal reflux/heartburn   [] Abdominal pain Genitourinary:  [] Chronic kidney disease   [] Difficult urination  [] Frequent urination  [] Burning with urination   [] Hematuria Skin:  [] Rashes   [] Ulcers   [] Wounds Psychological:  [] History of anxiety   []  History of major depression.  Physical Examination  BP (!) 144/90   Pulse 79   Wt 119 lb 4 oz (54.1 kg)   BMI 18.68 kg/m  Gen:  WD/WN, NAD.  Appears younger than stated age Head: [place]/AT, No temporalis wasting. Ear/Nose/Throat: Hearing grossly intact, nares w/o erythema or drainage Eyes: Conjunctiva clear. Sclera non-icteric Neck: Supple.  Trachea midline Pulmonary:  Good air movement, no use of accessory muscles.  Cardiac: RRR, no JVD Vascular: Diffuse varicosities present throughout Vessel Right Left  Radial Palpable Palpable           Musculoskeletal: M/S 5/5 throughout.  No deformity or atrophy.  Trace right lower extremity edema. Neurologic: Sensation grossly intact in extremities.  Symmetrical.  Speech is fluent.  Psychiatric: Judgment intact, Mood & affect appropriate for pt's clinical situation. Dermatologic: No rashes or ulcers noted.  No cellulitis or open wounds.      Labs No results found for this or any previous visit (from the past 2160 hours).  Radiology No results found.  Assessment/Plan  Swelling of limb  A venous reflux study was performed demonstrating no evidence of DVT or superficial thrombophlebitis.  No significant venous reflux is seen in the right lower extremity.  No intervention likely to be of benefit.  Recommend she continue wearing her compression socks and elevating her legs.  Increasing activity is much as possible is also helpful.  I can see her back in 1  year.  Hyperlipidemia lipid control important in reducing the progression of atherosclerotic disease. Continue statin therapy    Selinda Gu, MD  04/21/2024 9:37 AM    This note was created with Dragon medical transcription system.  Any errors from dictation are purely unintentional

## 2024-04-21 DIAGNOSIS — E785 Hyperlipidemia, unspecified: Secondary | ICD-10-CM | POA: Insufficient documentation

## 2024-04-21 NOTE — Assessment & Plan Note (Signed)
 A venous reflux study was performed demonstrating no evidence of DVT or superficial thrombophlebitis.  No significant venous reflux is seen in the right lower extremity.  No intervention likely to be of benefit.  Recommend she continue wearing her compression socks and elevating her legs.  Increasing activity is much as possible is also helpful.  I can see her back in 1 year.

## 2024-04-21 NOTE — Assessment & Plan Note (Signed)
 lipid control important in reducing the progression of atherosclerotic disease. Continue statin therapy

## 2024-05-04 ENCOUNTER — Ambulatory Visit (HOSPITAL_COMMUNITY): Payer: Self-pay | Admitting: Internal Medicine

## 2024-05-05 ENCOUNTER — Telehealth (HOSPITAL_COMMUNITY): Payer: Self-pay | Admitting: *Deleted

## 2024-05-05 NOTE — Telephone Encounter (Signed)
 Called patient's husband per Dr. Bensimhon with following ultrasound results:  No DVT  Husband voiced understanding of above and will share with his wife.

## 2024-05-13 ENCOUNTER — Telehealth (INDEPENDENT_AMBULATORY_CARE_PROVIDER_SITE_OTHER): Payer: Self-pay | Admitting: Vascular Surgery

## 2024-05-13 NOTE — Telephone Encounter (Addendum)
 Spoken to patient and notified Dr Fransisca comments. Verbalized understanding.   Patient also mention she would put cold compression and it helps. Inform patient that is not what Dr Marea said but if that is something she wants to do then that is up to her.

## 2024-05-13 NOTE — Telephone Encounter (Signed)
 Patient left a message, stating what can she do regarding the swelling in the legs, especially the right leg. Patient was last seen on 04/18/2024 with Dr Marea.   I did call patient back, she confirms that she is wearing her compression socks and elevating her legs. However, she does feel like it is helping with swelling. She noticed it is worse the morning and the swelling is moving throughout the day.  Please advise.

## 2024-06-10 ENCOUNTER — Ambulatory Visit
Admission: RE | Admit: 2024-06-10 | Discharge: 2024-06-10 | Disposition: A | Discharge status: Home / Self Care | Source: Ambulatory Visit | Attending: Nurse Practitioner | Admitting: Nurse Practitioner

## 2024-06-10 ENCOUNTER — Other Ambulatory Visit: Payer: Self-pay | Admitting: Nurse Practitioner

## 2024-06-10 DIAGNOSIS — R269 Unspecified abnormalities of gait and mobility: Secondary | ICD-10-CM

## 2024-06-10 DIAGNOSIS — Z8673 Personal history of transient ischemic attack (TIA), and cerebral infarction without residual deficits: Secondary | ICD-10-CM | POA: Diagnosis present

## 2024-06-10 DIAGNOSIS — R42 Dizziness and giddiness: Secondary | ICD-10-CM | POA: Insufficient documentation

## 2024-06-12 ENCOUNTER — Ambulatory Visit: Admitting: Occupational Therapy

## 2024-06-17 ENCOUNTER — Encounter: Admitting: Occupational Therapy

## 2024-06-18 ENCOUNTER — Ambulatory Visit

## 2024-06-20 ENCOUNTER — Encounter: Admitting: Occupational Therapy

## 2024-06-23 NOTE — Therapy (Unsigned)
 OUTPATIENT OCCUPATIONAL THERAPY BREAST CANCER POST SURGERY EVALUATION    Patient Name: Judy Jenkins MRN: [id number] DOB:1934/08/28, 89 y.o., female Today's Date: 06/24/2024  END OF SESSION:  OT End of Session - 06/24/24 1731     Visit Number 1    Number of Visits 6    Date for Recertification  08/05/24    OT Start Time 1217    OT Stop Time 1300    OT Time Calculation (min) 43 min    Activity Tolerance Patient tolerated treatment well    Behavior During Therapy [facility] for tasks assessed/performed          Past Medical History:  Diagnosis Date   Cancer (HCC)    Tachycardia    Past Surgical History:  Procedure Laterality Date   ABDOMINAL HYSTERECTOMY     BREAST SURGERY     CHOLECYSTECTOMY     Patient Active Problem List   Diagnosis Date Noted   Hyperlipidemia 04/21/2024   Recurrent syncope 11/25/2023   SAH (subarachnoid hemorrhage) (HCC) 11/24/2023   Rib pain on left side 03/15/2023   Muscle strain 03/15/2023   Positive D dimer 02/13/2022   Grade I diastolic dysfunction, LVH 02/13/2022   Dizziness 02/12/2022   Pain in limb 12/22/2021   Swelling of limb 12/22/2021   Breast cancer (HCC) 12/22/2021   Orthostatic hypotension 02/03/2019   Acquired hypothyroidism 04/26/2011   Anxiety 04/26/2011   GERD (gastroesophageal reflux disease) w/ large hiatal hernia  04/26/2011    PCP: Madie Favor PCP  REFERRING PROVIDER: Dr Sybil MART DIAG: L UE swelling / lymphedema  THERAPY DIAG:  Swelling of limb  Stiffness of right shoulder, not elsewhere classified  Rationale for Evaluation and Treatment: Rehabilitation  ONSET DATE: 05/23/24  SUBJECTIVE:                                                                                                                                                                                           SUBJECTIVE STATEMENT:  PERTINENT HISTORY:  05/23/24 Surgical [facility] visit  1. 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  -- No radiation.  -- Began AI in 01/2012. Finished in 01/2017 -- Mammo in 05/2021 benign  2. Axillary recurrence: ALND 07/09/2020 --3/22 nodes + --Note that staging with CT CAP in October 2021was negative.  --Completed XRT and started AI in late feb 2022- plan for 5 years.  Right upper extremity lymphedema secondary to breast cancer treatment She experiences swelling in the right upper extremity esp hand) likely due to lymphedema from lymph noderemoval and radiation during breast cancer treatment. -  Refer to lymphedema therapy in [place] for evaluation and potential compression glove fitting.    06/16/24 surgical [facility] visit  HPI: Judy Jenkins is a 89 y.o. female who presents for followup after breast cancer surgery. Below is a synopsis of past and present health information pertinent to her breast cancer diagnosis, treatment and current health status. She is accompanied today by her husband Judy Jenkins, who also contributes to the history. [name] was last seen in clinic on 01/17/24 for right breast pain. She had work up to include a right breast mammogram and ultrasound which demonstrated no suspicious masses or calcifications at the lumpectomy bed, no sonographic correlate at the palpable area of concern but did reveal progressive skin thickening and trabecular thickening of the right breast concerning for unilateral breast edema. Findings were concerning for post radiation changes versus occult malignancy such as ILC. She had a right breast skin punch biopsy that day that resulted as benign skin with dermal fibrosis and mildly ectatic lymphatics. She also went on to have a bilateral breast MRI on 01/31/24 which was negative for any concerning findings. Given symptoms with negative workup, discussed that this was lymphedema and that she was recommended to be seen by lymphedema therapy as well as wear a compression bra.  She presents to  clinic today. She states her right breast pain has significantly improved although she does experience some discomfort every few weeks. She notes that there is also improvement in the right breast swelling but that it does increase dependant on her activity. She does not wear a bra as the bras she has purchased do not fit her well and ride up her chest wall. A new order was placed for lymphedema therapy at [facility]. She has received a call to schedule but she has deferred. She has not been feeling well. She suffers from orthostatic hypotension but states she has been not feeling well since her Prolia injection on 10/3. She states that all of her joints hurt her and she has no energy. Otherwise, she denies any associated symptoms such as redness, fever, chills, breast mass, breast retraction, nipple inversion, nipple discharge. No difficulty with ROM, new onset of headaches, vision changes, shortness of breast, persistent cough, decreased appetite, changes to bowel or bladder habits or new bone or muscle pain. She continues on Letrozole  without any untoward side effects.   Historically, Adilynne presented with a history of a T1c N0, ER/PR positive, HER-2 negative right breast cancer, status post right lumpectomy in 12/2011. No radiation. Began AI in 01/2012. Finished in 01/2017 though unclear degree of compliance. The patient then presented 05/25/20 for her annual visit with Dr. Sybil where she had an abnormal screening mammogram performed on 05/25/2020 at [facility] that showed significant interval enlargement of a right axillary lymph node, just superior to the lumpectomy site. Subsequent ultrasound showed an abnormally thickened lymph node, with maximum cortical thickness of 0.8 cm. Subsequent biopsy of the right axilla revealed lymph node with recurrent/metastatic breast carcinoma, ER + (100%), PR + (20%), HER2 negative (1+).   Diagnosis:   PATIENT GOALS:   reduce lymphedema risk and learn post op HEP.   PAIN:  Are  you having pain? 3-4o/10 pain R pect and lateral chest with over head shoulder ROM   PRECAUTIONS: Right upper extremity lymphedema     HAND DOMINANCE: right  WEIGHT BEARING RESTRICTIONS: No  FALLS:  Has patient fallen in last 6 months? No  LIVING ENVIRONMENT: Patient lives with: Spouse of 67 years  OCCUPATION: Retired  LEISURE: Doing some housework activities.  Driving.  Spending time with family especially watching great grandkids sports.   OBJECTIVE:  COGNITION: Overall cognitive status: Within functional limits for tasks assessed    POSTURE:  Forward head and rounded shoulders posture  UPPER EXTREMITY AROM/PROM:  A/PROM RIGHT   eval   Shoulder extension   Shoulder flexion 110  Shoulder abduction 88  Shoulder internal rotation   Shoulder external rotation     (Blank rows = not tested)  A/PROM LEFT   eval  Shoulder extension   Shoulder flexion   Shoulder abduction   Shoulder internal rotation   Shoulder external rotation     (Blank rows = not tested)    LYMPHEDEMA ASSESSMENTS:     LYMPHEDEMA/ONCOLOGY QUESTIONNAIRE - 06/24/24 0001       Right Upper Extremity Lymphedema   15 cm Proximal to Olecranon Process 26.4 cm    10 cm Proximal to Olecranon Process 24 cm    Olecranon Process 23.5 cm    15 cm Proximal to Ulnar Styloid Process 21.5 cm    10 cm Proximal to Ulnar Styloid Process 19 cm    Just Proximal to Ulnar Styloid Process 15.7 cm    Across Hand at [facility] 18.4 cm    At [place] of 2nd Digit 6.2 cm    At [facility] of Thumb 6.5 cm      Left Upper Extremity Lymphedema   15 cm Proximal to Olecranon Process 27 cm    10 cm Proximal to Olecranon Process 24.5 cm    Olecranon Process 22.5 cm    15 cm Proximal to Ulnar Styloid Process 20 cm    10 cm Proximal to Ulnar Styloid Process 18.2 cm    Just Proximal to Ulnar Styloid Process 15 cm    Across Hand at [facility] 17.5 cm    At [place] of 2nd Digit 5.5 cm    At [facility] of Thumb 6 cm              PATIENT EDUCATION:  Patient was fitted isotoner glove on R hand to wear a much as she can  Ed on modify MLD and retrograde massage - to forearm distal to proximal 20 reps Hand to elbow 20 reps  And hand to elbow 20 reps with gravity  3 x day Pulleys' provided and review with pt to do 2 x day 20 reps for R shoulder flexion and Abd 20 reps AAROM  Tolerate well -    ASSESSMENT:  CLINICAL IMPRESSION: Pt present at OT eval today with diagnosis of Right upper extremity lymphedema secondary to breast cancer treatment She experiences swelling in the right upper extremity esp hand) likely due to lymphedema from lymph noderemoval and radiation during breast cancer treatment- pt refer by her surgical team at [facility]- pt hx includes : .1. 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  -- No radiation.  -- Began AI in 01/2012. Finished in 01/2017 -- Mammo in 05/2021 benign  2. Axillary recurrence: ALND 07/09/2020 --3/22 nodes + --Note that staging with CT CAP in October 2021was negative.  --Completed XRT and started AI in late feb 2022- plan for 5 years.  . Pt present with increase circumference mostly at  R hand, wrist to forearm - elbow and upper arm WNL range- pt cannot remember when it started - pt also show and report some tenderness and fibrosis in R  breast - and fuller than R - increase pain and stiffness/tightness at pect major and under arm with over head shoulder flexion and ABD - decrease to 90 -100 degrees - pain discomfort 3-4/10 - pt using Voltaren ointment that helps - bother by discomfort on pect and scapula except if reaching over head. Pt at risk for lymphedema because of amount of ln removed on the R -   Pt will benefit from skilled therapeutic intervention to improve on the following deficits: Decreased knowledge of precautions and lymphedema education, impaired UE functional use,  circumference - pain, decreased ROM,  postural dysfunction.   OT treatment/interventions: ADL/self-care home management, pt/family education, therapeutic exercise,manual therapy  REHAB POTENTIAL: Good  CLINICAL DECISION MAKING: Stable/uncomplicated  EVALUATION COMPLEXITY: Low   GOALS: Goals reviewed with patient? YES  LONG TERM GOALS: (STG=LTG)    Name Target Date Goal status  1 Pt will be able to verbalize understanding of pertinent lymphedema risk reduction practices relevant to her dx specifically related to skin care.  Baseline:  No knowledge 6 wks New   2  Pt R UE circumference decrease in hand , wrist within normal range to decrease risk for cellulitis   BASELINE: digits increase 0.7 cm , hand 1 cm , wrist 0.7 cm and forearm 1.5 cm  6 wks  New        4 Pt will demo HEP for R shoulder ROM  to show increase motion  Baseline: See objective measurements taken today. 6 wks New    PLAN:  OT FREQUENCY/DURATION: Eval and 5 visits    Occupational Therapy Information for After Breast Cancer Surgery/Treatment:  Lymphedema is a swelling condition that you may be at risk for in your arm if you have lymph nodes removed from the armpit area.  After a sentinel node biopsy, the risk is approximately 5-9% and is higher after an axillary node dissection.  There is treatment available for this condition and it is not life-threatening.  Contact your physician or occupational therapist with concerns. You may begin the 4 shoulder/posture exercises (see additional sheet) when permitted by your physician (typically a week after surgery).  If you have drains, you may need to wait until those are removed before beginning range of motion exercises.  A general recommendation is to not lift your arms above shoulder height until drains are removed.  These exercises should be done to your tolerance and gently.  This is not a no pain/no gain type of recovery so listen to your body and stretch into the range of motion that you can tolerate,  stopping if you have pain.  If you are having immediate reconstruction, ask your plastic surgeon about doing exercises as he or she may want you to wait. .  While undergoing any medical procedure or treatment, try to avoid blood pressure being taken or needle sticks from occurring on the arm on the side of cancer.   This recommendation begins after surgery and continues for the rest of your life.  This may help reduce your risk of getting lymphedema (swelling in your arm). An excellent resource for those seeking information on lymphedema is the National Lymphedema Network's web site. It can be accessed at www.lymphnet.org If you notice swelling in your hand, arm or breast at any time following surgery (even if it is many years from now), please contact your doctor or occupational therapist to discuss this.  Lymphedema can be treated at any time but it is easier for you if it  is treated early on.  If you feel like your shoulder motion is not returning to normal in a reasonable amount of time, please contact your surgeon or occupational therapist.  [facility] [phone]. [address] [place], [place] [address]      Ancel Peters, OTR/L,CLT 06/24/2024, 5:33 PM

## 2024-06-24 ENCOUNTER — Encounter: Payer: Self-pay | Admitting: Occupational Therapy

## 2024-06-24 ENCOUNTER — Ambulatory Visit: Attending: Internal Medicine | Admitting: Occupational Therapy

## 2024-06-24 DIAGNOSIS — M7989 Other specified soft tissue disorders: Secondary | ICD-10-CM | POA: Insufficient documentation

## 2024-06-24 DIAGNOSIS — M25611 Stiffness of right shoulder, not elsewhere classified: Secondary | ICD-10-CM | POA: Diagnosis present

## 2024-07-01 ENCOUNTER — Ambulatory Visit: Admitting: Occupational Therapy

## 2024-07-08 ENCOUNTER — Ambulatory Visit: Admitting: Occupational Therapy

## 2024-07-08 DIAGNOSIS — M7989 Other specified soft tissue disorders: Secondary | ICD-10-CM | POA: Diagnosis not present

## 2024-07-08 DIAGNOSIS — M25611 Stiffness of right shoulder, not elsewhere classified: Secondary | ICD-10-CM

## 2024-07-08 NOTE — Therapy (Signed)
 OUTPATIENT OCCUPATIONAL THERAPY BREAST CANCER POST SURGERY TREATMENT   Patient Name: Judy Jenkins MRN: [id number] DOB:1935-02-05, 89 y.o., female Today's Date: 07/08/2024  END OF SESSION:  OT End of Session - 07/08/24 1405     Visit Number 2    Number of Visits 6    Date for Recertification  08/05/24    OT Start Time 1405    OT Stop Time 1432    OT Time Calculation (min) 27 min    Activity Tolerance Patient tolerated treatment well    Behavior During Therapy [facility] for tasks assessed/performed          Past Medical History:  Diagnosis Date   Cancer (HCC)    Tachycardia    Past Surgical History:  Procedure Laterality Date   ABDOMINAL HYSTERECTOMY     BREAST SURGERY     CHOLECYSTECTOMY     Patient Active Problem List   Diagnosis Date Noted   Hyperlipidemia 04/21/2024   Recurrent syncope 11/25/2023   SAH (subarachnoid hemorrhage) (HCC) 11/24/2023   Rib pain on left side 03/15/2023   Muscle strain 03/15/2023   Positive D dimer 02/13/2022   Grade I diastolic dysfunction, LVH 02/13/2022   Dizziness 02/12/2022   Pain in limb 12/22/2021   Swelling of limb 12/22/2021   Breast cancer (HCC) 12/22/2021   Orthostatic hypotension 02/03/2019   Acquired hypothyroidism 04/26/2011   Anxiety 04/26/2011   GERD (gastroesophageal reflux disease) w/ large hiatal hernia  04/26/2011    PCP: Madie Favor PCP  REFERRING PROVIDER: Dr Sybil MART DIAG: L UE swelling / lymphedema  THERAPY DIAG:  Swelling of limb  Stiffness of right shoulder, not elsewhere classified  Rationale for Evaluation and Treatment: Rehabilitation  ONSET DATE: 05/23/24  SUBJECTIVE:                                                                                                                                                                                           SUBJECTIVE STATEMENT:  My swelling in my hand, wrist and forearm is better - still in the thumb and index -but I think it is  arthritis - my pain under my arm at my side is better- I have been wearing that glove and doing the exercises every day- have some issues with my BP - and dizziness - seeing the Dr's   PERTINENT HISTORY:  05/23/24 Surgical [facility] visit  1. 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  -- No radiation.  -- Began AI in 01/2012. Finished in 01/2017 -- Mammo in 05/2021 benign  2. Axillary recurrence: ALND 07/09/2020 --  3/22 nodes + --Note that staging with CT CAP in October 2021was negative.  --Completed XRT and started AI in late feb 2022- plan for 5 years.  Right upper extremity lymphedema secondary to breast cancer treatment She experiences swelling in the right upper extremity esp hand) likely due to lymphedema from lymph noderemoval and radiation during breast cancer treatment. - Refer to lymphedema therapy in [place] for evaluation and potential compression glove fitting.    06/16/24 surgical [facility] visit  HPI: Judy Jenkins is a 89 y.o. female who presents for followup after breast cancer surgery. Below is a synopsis of past and present health information pertinent to her breast cancer diagnosis, treatment and current health status. She is accompanied today by her husband Vinie, who also contributes to the history. Judy Jenkins was last seen in clinic on 01/17/24 for right breast pain. She had work up to include a right breast mammogram and ultrasound which demonstrated no suspicious masses or calcifications at the lumpectomy bed, no sonographic correlate at the palpable area of concern but did reveal progressive skin thickening and trabecular thickening of the right breast concerning for unilateral breast edema. Findings were concerning for post radiation changes versus occult malignancy such as ILC. She had a right breast skin punch biopsy that day that resulted as benign skin with dermal fibrosis and mildly ectatic lymphatics. She also  went on to have a bilateral breast MRI on 01/31/24 which was negative for any concerning findings. Given symptoms with negative workup, discussed that this was lymphedema and that she was recommended to be seen by lymphedema therapy as well as wear a compression bra.  She presents to clinic today. She states her right breast pain has significantly improved although she does experience some discomfort every few weeks. She notes that there is also improvement in the right breast swelling but that it does increase dependant on her activity. She does not wear a bra as the bras she has purchased do not fit her well and ride up her chest wall. A new order was placed for lymphedema therapy at [facility]. She has received a call to schedule but she has deferred. She has not been feeling well. She suffers from orthostatic hypotension but states she has been not feeling well since her Prolia injection on 10/3. She states that all of her joints hurt her and she has no energy. Otherwise, she denies any associated symptoms such as redness, fever, chills, breast mass, breast retraction, nipple inversion, nipple discharge. No difficulty with ROM, new onset of headaches, vision changes, shortness of breast, persistent cough, decreased appetite, changes to bowel or bladder habits or new bone or muscle pain. She continues on Letrozole  without any untoward side effects.   Historically, Lyanna presented with a history of a T1c N0, ER/PR positive, HER-2 negative right breast cancer, status post right lumpectomy in 12/2011. No radiation. Began AI in 01/2012. Finished in 01/2017 though unclear degree of compliance. The patient then presented 05/25/20 for her annual visit with Dr. Sybil where she had an abnormal screening mammogram performed on 05/25/2020 at [facility] that showed significant interval enlargement of a right axillary lymph node, just superior to the lumpectomy site. Subsequent ultrasound showed an abnormally thickened lymph node,  with maximum cortical thickness of 0.8 cm. Subsequent biopsy of the right axilla revealed lymph node with recurrent/metastatic breast carcinoma, ER + (100%), PR + (20%), HER2 negative (1+).   Diagnosis:   PATIENT GOALS:   reduce lymphedema risk and learn post op HEP.  PAIN:  Are you having pain? 3-4/10 pain R pect with over head shoulder ROM   PRECAUTIONS: Right upper extremity lymphedema     HAND DOMINANCE: right  WEIGHT BEARING RESTRICTIONS: No  FALLS:  Has patient fallen in last 6 months? No  LIVING ENVIRONMENT: Patient lives with: Spouse of 39 years  OCCUPATION: Retired  LEISURE: Doing some housework activities.  Driving.  Spending time with family especially watching great grandkids sports.   OBJECTIVE:  COGNITION: Overall cognitive status: Within functional limits for tasks assessed    POSTURE:  Forward head and rounded shoulders posture  UPPER EXTREMITY AROM/PROM:  A/PROM RIGHT   eval   Shoulder extension   Shoulder flexion 110  Shoulder abduction 88  Shoulder internal rotation   Shoulder external rotation     (Blank rows = not tested)  A/PROM LEFT   eval  Shoulder extension   Shoulder flexion   Shoulder abduction   Shoulder internal rotation   Shoulder external rotation     (Blank rows = not tested)    LYMPHEDEMA ASSESSMENTS:     LYMPHEDEMA/ONCOLOGY QUESTIONNAIRE - 07/08/24 0001       Right Upper Extremity Lymphedema   15 cm Proximal to Olecranon Process 25 cm    10 cm Proximal to Olecranon Process 24.5 cm    Olecranon Process 23 cm    15 cm Proximal to Ulnar Styloid Process 21.4 cm    10 cm Proximal to Ulnar Styloid Process 18.5 cm    Just Proximal to Ulnar Styloid Process 15.4 cm    Across Hand at [facility] 18 cm    At [place] of 2nd Digit 6.4 cm    At [facility] of Thumb 6.5 cm              Session  Patient was fitted isotoner glove on R hand to wear a much as she can last visit -with great progress  Pt to cont to wear   Circumference decrease in R UE - see flowsheet Pt to cont with contrast to R hand - 2-3 x day  Appear pt has some arthritis- enlarge 2nd and 3rd MC and thumb   Review again  Ed on modify MLD and retrograde massage - to forearm distal to proximal 20 reps Hand to elbow 20 reps  And hand to elbow 20 reps with gravity  3 x day  After contrast Can cont daily Pulleys' provided and review last visit 1- 2 x day 20 reps for R shoulder flexion and Abd 20 reps AAROM  To decrease stiffness and tightness in R thoracic Tolerate well -    ASSESSMENT:  CLINICAL IMPRESSION: Pt present at OT with diagnosis of Right upper extremity lymphedema secondary to breast cancer treatment -   first follow up today after eval about 2 wks ago - at eval pt experiences swelling in the right upper extremity esp hand) likely due to lymphedema from lymph noderemoval and radiation during breast cancer treatment- pt refer by her surgical team at [facility]- pt hx includes : .1. 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  -- No radiation.  -- Began AI in 01/2012. Finished in 01/2017 -- Mammo in 05/2021 benign  2. Axillary recurrence: ALND 07/09/2020 --3/22 nodes + --Note that staging with CT CAP in October 2021was negative.  --Completed XRT and started AI in late feb 2022- plan for 5 years.  . Pt present at eval with increase circumference mostly  at  R hand, wrist to forearm - elbow and upper arm WNL range- pt cannot remember when it started - pt also show and report some tenderness and fibrosis in R breast - and fuller than R - increase pain and stiffness/tightness at pect major and under arm with over head shoulder flexion and ABD - decrease to 90 -100 degrees - pain discomfort 3-4/10 - pt using Voltaren ointment that helps - bother by discomfort on pect and scapula except if reaching over head. Pt at risk for lymphedema because of amount of ln removed on the R -  NOW  pt show decrease circumference in R UE - cont to show enlarge 2nd and 3rd MC and thumb - ? Arthritic changes - pt report decrease discomfort and tenderness at thoracic and scapula - cont some tightness over Pect- but improving with HEP - pt to cont with HEP for 3 wks and follow up   Pt will benefit from skilled therapeutic intervention to improve on the following deficits: Decreased knowledge of precautions and lymphedema education, impaired UE functional use,  circumference - pain, decreased ROM, postural dysfunction.   OT treatment/interventions: ADL/self-care home management, pt/family education, therapeutic exercise,manual therapy  REHAB POTENTIAL: Good  CLINICAL DECISION MAKING: Stable/uncomplicated  EVALUATION COMPLEXITY: Low   GOALS: Goals reviewed with patient? YES  LONG TERM GOALS: (STG=LTG)    Name Target Date Goal status  1 Pt will be able to verbalize understanding of pertinent lymphedema risk reduction practices relevant to her dx specifically related to skin care.  Baseline:  No knowledge 6 wks New   2  Pt R UE circumference decrease in hand , wrist within normal range to decrease risk for cellulitis   BASELINE: digits increase 0.7 cm , hand 1 cm , wrist 0.7 cm and forearm 1.5 cm  6 wks  New        4 Pt will demo HEP for R shoulder ROM  to show increase motion  Baseline: See objective measurements taken today. 6 wks New    PLAN:  OT FREQUENCY/DURATION: Eval and 5 visits    Occupational Therapy Information for After Breast Cancer Surgery/Treatment:  Lymphedema is a swelling condition that you may be at risk for in your arm if you have lymph nodes removed from the armpit area.  After a sentinel node biopsy, the risk is approximately 5-9% and is higher after an axillary node dissection.  There is treatment available for this condition and it is not life-threatening.  Contact your physician or occupational therapist with concerns. You may begin the 4 shoulder/posture  exercises (see additional sheet) when permitted by your physician (typically a week after surgery).  If you have drains, you may need to wait until those are removed before beginning range of motion exercises.  A general recommendation is to not lift your arms above shoulder height until drains are removed.  These exercises should be done to your tolerance and gently.  This is not a no pain/no gain type of recovery so listen to your body and stretch into the range of motion that you can tolerate, stopping if you have pain.  If you are having immediate reconstruction, ask your plastic surgeon about doing exercises as he or she may want you to wait. .  While undergoing any medical procedure or treatment, try to avoid blood pressure being taken or needle sticks from occurring on the arm on the side of cancer.   This recommendation begins after surgery and continues for the  rest of your life.  This may help reduce your risk of getting lymphedema (swelling in your arm). An excellent resource for those seeking information on lymphedema is the National Lymphedema Network's web site. It can be accessed at www.lymphnet.org If you notice swelling in your hand, arm or breast at any time following surgery (even if it is many years from now), please contact your doctor or occupational therapist to discuss this.  Lymphedema can be treated at any time but it is easier for you if it is treated early on.  If you feel like your shoulder motion is not returning to normal in a reasonable amount of time, please contact your surgeon or occupational therapist.  [facility] [phone]. [address], [place], [place] [address]      Ancel Peters, OTR/L,CLT 07/08/2024, 5:47 PM

## 2024-07-10 ENCOUNTER — Other Ambulatory Visit: Payer: Self-pay

## 2024-07-10 MED ORDER — DROXIDOPA 100 MG PO CAPS
100.0000 mg | ORAL_CAPSULE | Freq: Three times a day (TID) | ORAL | 11 refills | Status: DC
  Filled 2024-07-10: qty 90, 30d supply, fill #0

## 2024-07-29 ENCOUNTER — Ambulatory Visit: Admitting: Occupational Therapy

## 2024-07-30 ENCOUNTER — Inpatient Hospital Stay: Attending: Oncology | Admitting: Occupational Therapy

## 2024-07-30 DIAGNOSIS — M7989 Other specified soft tissue disorders: Secondary | ICD-10-CM | POA: Insufficient documentation

## 2024-07-30 DIAGNOSIS — M25611 Stiffness of right shoulder, not elsewhere classified: Secondary | ICD-10-CM

## 2024-07-30 NOTE — Therapy (Signed)
 OUTPATIENT OCCUPATIONAL THERAPY BREAST CANCER POST SURGERY TREATMENT   Patient Name: Judy Jenkins MRN: [id number] DOB:February 09, 1935, 89 y.o., female Today's Date: 07/30/2024  END OF SESSION:  OT End of Session - 07/30/24 1659     Visit Number 3    Number of Visits 6    Date for Recertification  08/05/24    OT Start Time 1402    OT Stop Time 1420    OT Time Calculation (min) 18 min    Activity Tolerance Patient tolerated treatment well    Behavior During Therapy [facility] for tasks assessed/performed          Past Medical History:  Diagnosis Date   Cancer (HCC)    Tachycardia    Past Surgical History:  Procedure Laterality Date   ABDOMINAL HYSTERECTOMY     BREAST SURGERY     CHOLECYSTECTOMY     Patient Active Problem List   Diagnosis Date Noted   Hyperlipidemia 04/21/2024   Recurrent syncope 11/25/2023   SAH (subarachnoid hemorrhage) (HCC) 11/24/2023   Rib pain on left side 03/15/2023   Muscle strain 03/15/2023   Positive D dimer 02/13/2022   Grade I diastolic dysfunction, LVH 02/13/2022   Dizziness 02/12/2022   Pain in limb 12/22/2021   Swelling of limb 12/22/2021   Breast cancer (HCC) 12/22/2021   Orthostatic hypotension 02/03/2019   Acquired hypothyroidism 04/26/2011   Anxiety 04/26/2011   GERD (gastroesophageal reflux disease) w/ large hiatal hernia  04/26/2011    PCP: Madie Favor PCP  REFERRING PROVIDER: Dr Sybil MART DIAG: L UE swelling / lymphedema  THERAPY DIAG:  Swelling of limb  Stiffness of right shoulder, not elsewhere classified  Rationale for Evaluation and Treatment: Rehabilitation  ONSET DATE: 05/23/24  SUBJECTIVE:                                                                                                                                                                                           SUBJECTIVE STATEMENT:  My swelling in my hand, wrist and forearm is better - I am wearing the glove night time and some during day -  I still think it was my arthritis  - my pain under my arm at my side is better- I have been wearing that glove and doing the exercises every day-I put some Voltaren on - and that helps- but the BP and dizziness thing bothers me more than anything else - it is when I get up   PERTINENT HISTORY:  05/23/24 Surgical [facility] visit  1. 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  --  No radiation.  -- Began AI in 01/2012. Finished in 01/2017 -- Mammo in 05/2021 benign  2. Axillary recurrence: ALND 07/09/2020 --3/22 nodes + --Note that staging with CT CAP in October 2021was negative.  --Completed XRT and started AI in late feb 2022- plan for 5 years.  Right upper extremity lymphedema secondary to breast cancer treatment She experiences swelling in the right upper extremity esp hand) likely due to lymphedema from lymph noderemoval and radiation during breast cancer treatment. - Refer to lymphedema therapy in [place] for evaluation and potential compression glove fitting.    06/16/24 surgical [facility] visit  HPI: Judy Jenkins is a 89 y.o. female who presents for followup after breast cancer surgery. Below is a synopsis of past and present health information pertinent to her breast cancer diagnosis, treatment and current health status. She is accompanied today by her husband Vinie, who also contributes to the history. [name] was last seen in clinic on 01/17/24 for right breast pain. She had work up to include a right breast mammogram and ultrasound which demonstrated no suspicious masses or calcifications at the lumpectomy bed, no sonographic correlate at the palpable area of concern but did reveal progressive skin thickening and trabecular thickening of the right breast concerning for unilateral breast edema. Findings were concerning for post radiation changes versus occult malignancy such as ILC. She had a right breast skin punch biopsy that  day that resulted as benign skin with dermal fibrosis and mildly ectatic lymphatics. She also went on to have a bilateral breast MRI on 01/31/24 which was negative for any concerning findings. Given symptoms with negative workup, discussed that this was lymphedema and that she was recommended to be seen by lymphedema therapy as well as wear a compression bra.  She presents to clinic today. She states her right breast pain has significantly improved although she does experience some discomfort every few weeks. She notes that there is also improvement in the right breast swelling but that it does increase dependant on her activity. She does not wear a bra as the bras she has purchased do not fit her well and ride up her chest wall. A new order was placed for lymphedema therapy at [facility]. She has received a call to schedule but she has deferred. She has not been feeling well. She suffers from orthostatic hypotension but states she has been not feeling well since her Prolia injection on 10/3. She states that all of her joints hurt her and she has no energy. Otherwise, she denies any associated symptoms such as redness, fever, chills, breast mass, breast retraction, nipple inversion, nipple discharge. No difficulty with ROM, new onset of headaches, vision changes, shortness of breast, persistent cough, decreased appetite, changes to bowel or bladder habits or new bone or muscle pain. She continues on Letrozole  without any untoward side effects.   Historically, Abbigail presented with a history of a T1c N0, ER/PR positive, HER-2 negative right breast cancer, status post right lumpectomy in 12/2011. No radiation. Began AI in 01/2012. Finished in 01/2017 though unclear degree of compliance. The patient then presented 05/25/20 for her annual visit with Dr. Sybil where she had an abnormal screening mammogram performed on 05/25/2020 at [facility] that showed significant interval enlargement of a right axillary lymph node, just  superior to the lumpectomy site. Subsequent ultrasound showed an abnormally thickened lymph node, with maximum cortical thickness of 0.8 cm. Subsequent biopsy of the right axilla revealed lymph node with recurrent/metastatic breast carcinoma, ER + (100%), PR + (  20%), HER2 negative (1+).   Diagnosis:   PATIENT GOALS:   reduce lymphedema risk and learn post op HEP.   PAIN:  Are you having pain?Discomfort with over head ROM on R   PRECAUTIONS: Right upper extremity lymphedema     HAND DOMINANCE: right  WEIGHT BEARING RESTRICTIONS: No  FALLS:  Has patient fallen in last 6 months? No  LIVING ENVIRONMENT: Patient lives with: Spouse of 49 years  OCCUPATION: Retired  LEISURE: Doing some housework activities.  Driving.  Spending time with family especially watching great grandkids sports.   OBJECTIVE:  COGNITION: Overall cognitive status: Within functional limits for tasks assessed    POSTURE:  Forward head and rounded shoulders posture  UPPER EXTREMITY AROM/PROM:  A/PROM RIGHT   eval  R 07/30/24  Shoulder extension    Shoulder flexion 110 130  Shoulder abduction 88 105  Shoulder internal rotation    Shoulder external rotation      (Blank rows = not tested)  A/PROM LEFT   eval  Shoulder extension   Shoulder flexion   Shoulder abduction   Shoulder internal rotation   Shoulder external rotation     (Blank rows = not tested)    LYMPHEDEMA ASSESSMENTS:     LYMPHEDEMA/ONCOLOGY QUESTIONNAIRE - 07/30/24 0001       Right Upper Extremity Lymphedema   15 cm Proximal to Olecranon Process 25 cm    10 cm Proximal to Olecranon Process 23 cm    Olecranon Process 22.2 cm    15 cm Proximal to Ulnar Styloid Process 21 cm    10 cm Proximal to Ulnar Styloid Process 18.2 cm    Just Proximal to Ulnar Styloid Process 15.2 cm    Across Hand at [facility] 18 cm    At [place] of 2nd Digit 6.3 cm    At [facility] of Thumb 6.4 cm              Session  Patient was fitted  isotoner glove on R hand  at eval to wear a much as she can - last 2 visits-with great progress  Pt to cont to wear night time and some during day Circumference decrease in R UE - see flowsheet again since last time Pt to cont with contrast to R hand  as needed- 2-3 x day  Appear pt has some arthritis- causing increase circumference-  enlarge 2nd and 3rd MC and thumb   Review again HEP Ed on modify MLD and retrograde massage - to forearm distal to proximal 20 reps Hand to elbow 20 reps  And hand to elbow 20 reps with gravity  3 x day  After contrast Can cont daily Pulleys' provided and review  at eval 1- 2 x day 20 reps for R shoulder flexion and Abd 20 reps AAROM  To decrease stiffness and tightness in R thoracic progress with doing it daily Tolerate well -    ASSESSMENT:  CLINICAL IMPRESSION: Pt present at OT with diagnosis of Right upper extremity lymphedema secondary to breast cancer treatment -  at eval pt experiences swelling in the right upper extremity esp hand) likely due to lymphedema from lymph noderemoval and radiation during breast cancer treatment- pt refer by [facility] surgical [facility] at [facility]- pt hx includes : .1. 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  -- No radiation.  -- Began AI in 01/2012. Finished in 01/2017 -- Mammo in 05/2021 benign  2. Axillary  recurrence: ALND 07/09/2020 --3/22 nodes + --Note that staging with CT CAP in October 2021was negative.  --Completed XRT and started AI in late feb 2022- plan for 5 years.  . Pt present at eval with increase circumference mostly at  R hand, wrist to forearm - elbow and upper arm WNL range- pt cannot remember when it started - pt also show and report some tenderness and fibrosis in R breast - and fuller than R - increase pain and stiffness/tightness at pect major and under arm with over head shoulder flexion and ABD - decrease to 90 -100 degrees - pain discomfort 3-4/10  - pt using Voltaren ointment that helps - bother by discomfort on pect and scapula except if reaching over head. Pt at risk for lymphedema because of amount of ln removed on the R -  NOW pt show decrease circumference in R UE  the last 2 sessions- cont to show enlarge 2nd and 3rd MC and thumb - ? Arthritic changes  more than lymphedema- pt report decrease discomfort and tenderness at thoracic and scapula - cont some tightness over Pect- but improving with HEP - pt to cont with HEP daily using pulleys for ROM - and wearing isotoner glove night time and some during day- pt in agreement to be discharge at this time with HEP - pt bothered more by her BP and dizziness issues when standing up  Pt will benefit from skilled therapeutic intervention to improve on the following deficits: Decreased knowledge of precautions and lymphedema education, impaired UE functional use,  circumference - pain, decreased ROM, postural dysfunction.   OT treatment/interventions: ADL/self-care home management, pt/family education, therapeutic exercise,manual therapy  REHAB POTENTIAL: Good  CLINICAL DECISION MAKING: Stable/uncomplicated  EVALUATION COMPLEXITY: Low   GOALS: Goals reviewed with patient? YES  LONG TERM GOALS: (STG=LTG)    Name Target Date Goal status  1 Pt will be able to verbalize understanding of pertinent lymphedema risk reduction practices relevant to her dx specifically related to skin care.  Baseline:  No knowledge 6 wks met   2  Pt R UE circumference decrease in hand , wrist within normal range to decrease risk for cellulitis   BASELINE: digits increase 0.7 cm , hand 1 cm , wrist 0.7 cm and forearm 1.5 cm  6 wks  met        4 Pt will demo HEP for R shoulder ROM  to show increase motion  Baseline: See objective measurements taken today. 6 wks met    PLAN:  OT FREQUENCY/DURATION: Eval and 5 visits    Occupational Therapy Information for After Breast Cancer Surgery/Treatment:  Lymphedema  is a swelling condition that you may be at risk for in your arm if you have lymph nodes removed from the armpit area.  After a sentinel node biopsy, the risk is approximately 5-9% and is higher after an axillary node dissection.  There is treatment available for this condition and it is not life-threatening.  Contact your physician or occupational therapist with concerns. You may begin the 4 shoulder/posture exercises (see additional sheet) when permitted by your physician (typically a week after surgery).  If you have drains, you may need to wait until those are removed before beginning range of motion exercises.  A general recommendation is to not lift your arms above shoulder height until drains are removed.  These exercises should be done to your tolerance and gently.  This is not a no pain/no gain type of recovery so listen to your  body and stretch into the range of motion that you can tolerate, stopping if you have pain.  If you are having immediate reconstruction, ask your plastic surgeon about doing exercises as he or she may want you to wait. .  While undergoing any medical procedure or treatment, try to avoid blood pressure being taken or needle sticks from occurring on the arm on the side of cancer.   This recommendation begins after surgery and continues for the rest of your life.  This may help reduce your risk of getting lymphedema (swelling in your arm). An excellent resource for those seeking information on lymphedema is the National [employer] Network's web site. It can be accessed at www.lymphnet.org If you notice swelling in your hand, arm or breast at any time following surgery (even if it is many years from now), please contact your doctor or occupational therapist to discuss this.  Lymphedema can be treated at any time but it is easier for you if it is treated early on.  If you feel like your shoulder motion is not returning to normal in a reasonable amount of time, please contact your  surgeon or occupational therapist.  [facility] [phone]. [address], [place], [place] [address]      Ancel Peters, OTR/L,CLT 07/30/2024, 5:01 PM

## 2024-08-07 ENCOUNTER — Other Ambulatory Visit: Payer: Self-pay

## 2024-08-07 MED ORDER — DROXIDOPA 200 MG PO CAPS
200.0000 mg | ORAL_CAPSULE | Freq: Three times a day (TID) | ORAL | 2 refills | Status: AC
  Filled 2024-08-07: qty 90, 30d supply, fill #0
  Filled 2024-09-23: qty 90, 30d supply, fill #1

## 2024-08-08 ENCOUNTER — Other Ambulatory Visit: Payer: Self-pay

## 2024-08-15 ENCOUNTER — Other Ambulatory Visit: Payer: Self-pay

## 2024-08-20 NOTE — Progress Notes (Signed)
 Telemedicine telephone encounter documentation  This telephone encounter was conducted with the patient's (or proxy's) verbal consent via secure, interactive audio telecommunications while away from clinic/office/hospital.  The patient (or proxy) was instructed to have this encounter in a suitably private space and to only have persons present to whom they give permission to participate. In addition, patient identity was confirmed by use of name plus two identifiers.  Chief Complaint:   Chief Complaint  Patient presents with   Follow-up    Subjective:   Judy Jenkins is a 89 y.o. female established patient. Telephone visit today for: medication follow up History of Present Illness  PMH:             1.  Syncope             2.  Orthostatic hypotension             3.  Hypothyroidism             4.  Hyperlipidemia             5.  Breast cancer status lumpectomy and RUE lymphedema             6.  H/O CVA - MRI 03/02/23 revealed subacute CVA. On aspirin  and Plavix  per Neurology              7.  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  H/O hypokalemia, currently supplementing 20 mEq KCL qd             9.  No PVD by prior Vascular Surgery work up 01/2022                         - neg ABI bilaterally                         - neg BLE US              10. Inappropriate sinus tachycardia             11. Prediabetes             12. Chronic steroid therapy - Florinef ; on PPI   Family Cardiac History: Ischemic heart disease   Patient previously followed by Dr. Dodie at [facility] and Dr. Lawyer. The patient has had a history of orthostatic hypotension. She was seen by Dr. Lawyer following a syncopal episode at which time she was seen the ED where ECG revealed sinus rhythm with PVCs, and was seen by neurology who felt her symptoms were most consistent with labyrinthitis. The  patient was started on Florinef  with overall clinical improvement. Patient has had multiple follow ups due to orthostatic hypotension, presyncope, syncope and palpitations. She most recently was admitted to [facility] 11/24/23 due to syncope. Markedly orthostatic and symptomatic with over 40 point drop in systolic blood pressure upon standing in hospital. Cardiac work up in the past years for orthostatic hypotension and syncope has included: 30-day event monitor 02/14/2022 - 03/17/2022 revealed predominant sinus rhythm without serious bradycardia or tachyarrhythmia.   14-day Holter monitor 05/01/2023 - 05/15/2023 revealed predominant sinus rhythm with mean heart rate 71 bpm,  sinus heart rate range 54-110 bpm, infrequent premature ventricular contractions, infrequent premature atrial contractions, and infrequent atrial runs the longest lasting 11 beats.   2D echocardiogram 09/23/2021 revealed normal left ventricular function, with LVEF greater than 55%, with mild tricuspid regurgitation.  2D ECHO 11/24/2023 reassuring with normal systolic function, LVEF 65-70%, no significant valvular dysfunction, no valvular stenosis. Normal RV systolic function.   7-day Holter 11/30/23, worn for 4 days (off metoprolol ) revealed predominant sinus rhythm. Min HR 58, avg HR 81, max 129 bpm. Controlled sinus rate 92%, sinus tachycardia 8%. 1 occurrence of SVT, fastest 148 bpm, longest 4 beats. No significant ectopy. No significant bradycardia.  Patient felt worse off metoprolol  with symptomatic inappropriate sinus tachycardia.  Ivabradine was tried at 5 mg BID, with no improvement in symptomatic tachycardia, so was ultimately stopped. Metoprolol  XL 12.5 mg qd resumed. Increased to 25 mg qd for better symptom management. Patient remained orthostatic and symptomatic despite escalating doses of midodrine . Improved after near max doses of Florinef  0.1 mg qd and midodrine  10  mg TID.  Pt self weaned midodrine  to 5 mg QD, with SE scalp itching SE?,  and florinef  to 0.1 mg qd and was feeling improvement in symptoms for a few months.  Seen 06/10/24 reporting constant lightheadedness and bilateral leg weakness in last month. Reports feels different than her prior orthostatic symptoms. She reports new gait instability with inability to walk in a straight line, both of which are new in last month. CTA Neck 11/23/23 revealed no significant carotid artery disease bilaterally. Follows with Vascular Surgery and has had negative BLE venous and arterial disease w/u. Denies falls or syncope. BP 124/76, HR 68. EKG revealed NSR, HR 71 bpm. CT Head WO Contrast 06/10/24 revealed no acute abnormality. Recommended f/u with Neurology. Pt sent MyChart message 06/18/24 reporting worsening overall weakness and leg s/s. Increased midodrine  to 5 mg BID and continued florinef  0.1 mg qd.  Seen 06/26/24 c/o leg weakness, dizziness, presyncope. Increased midodrine  to 5mg  TID, continued Florinef  0.1 mg qd. Referred to Dr. Merline for dysautonomia evaluation. Encouraged hydration, high salt diet, compression stockings. Encouraged discussing PT with PCP. Stopped rosuvastatin  to see if leg weakness improved.  Saw Dr. Merline 07/01/24 - started on droxidopa  100 mg TID. Continued midodrine  at 10 mg TID dose. Stopped metoprolol  XL.  Seen 07/25/24 for follow up reporting leg weakness, dizziness, imbalance, and intermittent presyncope without syncope. She is compliant with droxidopa  100 mg TID and Florinef  0.1 mg QD. She has been taking midodrine  10 mg once daily. She resumed metoprolol  succinate due to heart palpitations and dyspnea after stopping it. These s/s resolved after resuming metoprolol . She has been increasing her hydration and drinks about 64 ounces of gatorade and water daily. She has increased her salt intake. Wearing knee high compression stockings. No orthostasis by evaluation in clinic. Orthostatics today: supine 124/76, HR 74, sitting 122/76, HR 75, standing 116/72, HR 82. Increased  midodrine  to 2.5 mg TID. Continued all other medications as prescribed. Encouraged increasing hydration to 3 L daily. Seen 08/07/24: She continues droxidopa  100 mg TID and Florinef  0.1 mg QD. She stopped midodrine  2.5 mg TID one week ago due to intolerable scalp itching. She continues metoprolol  succinate 25 mg qd - pt resumed due to intolerable palpitations after discontinuing. We increased droxidopa  to 200 mg TID, Continued Florinef  0.1 mg qd.    She presents for telephone follow up today.  I'm not doing well at all. She reports home blood pressures of 70/50 this  morning, and syncopal episode. She recently stopped the droxidopa  per MyChart message with Dr. Merline due to intolerable side effects. Reports she took it this morning. She sounds confused on the phone.     Patient Active Problem List  Diagnosis   Anxiety   Urethral caruncle   Fibromyalgia   Inappropriate sinus tachycardia (HHS-HCC)   GERD (gastroesophageal reflux disease)   Acquired hypothyroidism   DIVERTICULA OF COLON   Pure hypercholesterolemia   History of breast cancer   Hernia, hiatal   Constipation   Bilateral cataracts   Hearing loss in right ear   Hyperglycemia   Atrophic vaginitis   Adhesive capsulitis of shoulder   Orthostatic hypotension   Palpitation   Breast cancer (CMS/HHS-HCC)   Lymphadenopathy of head and neck   Hx of radiation therapy   Osteoporosis   Grade I diastolic dysfunction   Prediabetes   Trigger middle finger of left hand   Low potassium syndrome   Trigger ring finger of left hand   Aortic dilatation ()    Outpatient Medications Prior to Visit  Medication Sig Dispense Refill   amitriptyline  (ELAVIL ) 25 MG tablet Take 1 tablet (25 mg total) by mouth at bedtime 100 tablet 0   aspirin  81 MG EC tablet Take 81 mg by mouth once daily     BIOTIN ORAL Take by mouth     cholecalciferol (VITAMIN D3) 1000 unit capsule Take 1,000 Units by mouth once daily      clopidogreL  (PLAVIX ) 75 mg tablet Take 1 tablet (75 mg total) by mouth once daily 30 tablet 11   cyanocobalamin (VITAMIN B12) 1000 MCG tablet Take 1,000 mcg by mouth once daily     droxidopa  (NORTHERA ) 200 mg capsule Take 1 capsule (200 mg total) by mouth 3 (three) times daily with meals 270 capsule 2   esomeprazole (NEXIUM) 40 MG DR capsule Take 1 capsule (40 mg total) by mouth once daily 90 capsule 3   fludrocortisone  (FLORINEF ) 0.1 mg tablet Take 1 tablet (0.1 mg total) by mouth once daily 90 tablet 3   letrozole  (FEMARA ) 2.5 mg tablet Take 2.5 mg by mouth once daily     levothyroxine  (SYNTHROID ) 75 MCG tablet Take 1 tablet (75 mcg total) by mouth once daily Take on an empty stomach with a glass of water at least 30-60 minutes before breakfast. 90 tablet 3   metoprolol  SUCCinate (TOPROL -XL) 25 MG XL tablet Take 1 tablet (25 mg total) by mouth once daily 90 tablet 1   midodrine  (PROAMATINE ) 5 MG tablet Take 1 tablet (5 mg total) by mouth 3 (three) times daily as needed (systolic blood pressure less than 90) 90 tablet 11   potassium chloride  (KLOR-CON  M10) 10 mEq ER tablet Take 10 mEq by mouth once daily     rosuvastatin  (CRESTOR ) 40 MG tablet Take 40 mg by mouth once daily     No facility-administered medications prior to visit.    Review of Systems  Neurological:  Positive for loss of consciousness and weakness.     Objective:   Vitals as reported by patient: There were no vitals filed for this visit. There is no height or weight on file to calculate BMI. Patient sounds confused, weak.   Results    Assessment/Plan:   Assessment & Plan  Diagnoses and all orders for this visit:  Orthostatic hypotension  Syncope, unspecified syncope type   Spoke with patient and her husband, Vinie, on phone. Advised taking 10 mg of Midodrine  now. Call  EMS to transport to ER. Advised against trying to walk to car or have husband drive her.   This visit was coded based on medical  decision making (MDM).  Follow up after ER        Future Appointments     Date/Time Provider Department Center Visit Type   09/05/2024 10:30 AM (Arrive by 10:15 AM) Perri Constance Sor, PA [facility] OFFICE VISIT   09/23/2024 9:00 AM [facility] CARDIOLOGY HEART FAILURE [facility] C FOLLOW UP   09/30/2024 12:30 PM Lane Arthea Locus, MD [facility] C RETURN VISIT   10/13/2024 9:30 AM Elodie Ronal Tinnie Launie, NP [facility] C FOLLOW UP   10/15/2024 1:00 PM (Arrive by 12:45 PM) POPULATION HEALTH NURSE - [facility] WELLNESS   10/15/2024 1:30 PM (Arrive by 1:15 PM) Perri Constance Sor, PA [facility] OFFICE VISIT       There are no Patient Instructions on file for this visit.   An after visit summary was provided for the patient either in written format (printed) or through MyChart.  This note has been created using automated tools and reviewed for accuracy by RONAL TINNIE CHEEK PARRISH.

## 2024-08-22 ENCOUNTER — Ambulatory Visit

## 2024-08-22 ENCOUNTER — Encounter: Payer: Self-pay | Admitting: Emergency Medicine

## 2024-08-22 ENCOUNTER — Ambulatory Visit
Admission: EM | Admit: 2024-08-22 | Discharge: 2024-08-22 | Disposition: A | Discharge status: Home / Self Care | Attending: Physician Assistant | Admitting: Physician Assistant

## 2024-08-22 ENCOUNTER — Emergency Department

## 2024-08-22 ENCOUNTER — Emergency Department
Admission: EM | Admit: 2024-08-22 | Discharge: 2024-08-22 | Disposition: A | Discharge status: Home / Self Care | Source: Ambulatory Visit | Attending: Emergency Medicine | Admitting: Emergency Medicine

## 2024-08-22 ENCOUNTER — Other Ambulatory Visit: Payer: Self-pay

## 2024-08-22 DIAGNOSIS — R0602 Shortness of breath: Secondary | ICD-10-CM | POA: Insufficient documentation

## 2024-08-22 DIAGNOSIS — S51811A Laceration without foreign body of right forearm, initial encounter: Secondary | ICD-10-CM

## 2024-08-22 DIAGNOSIS — R55 Syncope and collapse: Secondary | ICD-10-CM | POA: Diagnosis not present

## 2024-08-22 DIAGNOSIS — R062 Wheezing: Secondary | ICD-10-CM

## 2024-08-22 DIAGNOSIS — Z853 Personal history of malignant neoplasm of breast: Secondary | ICD-10-CM | POA: Insufficient documentation

## 2024-08-22 DIAGNOSIS — R051 Acute cough: Secondary | ICD-10-CM

## 2024-08-22 DIAGNOSIS — Z23 Encounter for immunization: Secondary | ICD-10-CM | POA: Diagnosis not present

## 2024-08-22 DIAGNOSIS — I10 Essential (primary) hypertension: Secondary | ICD-10-CM | POA: Diagnosis not present

## 2024-08-22 LAB — BASIC METABOLIC PANEL WITH GFR
Anion gap: 9 (ref 5–15)
BUN: 15 mg/dL (ref 8–23)
CO2: 30 mmol/L (ref 22–32)
Calcium: 9.4 mg/dL (ref 8.9–10.3)
Chloride: 98 mmol/L (ref 98–111)
Creatinine, Ser: 0.93 mg/dL (ref 0.44–1.00)
GFR, Estimated: 59 mL/min — ABNORMAL LOW
Glucose, Bld: 107 mg/dL — ABNORMAL HIGH (ref 70–99)
Potassium: 3.9 mmol/L (ref 3.5–5.1)
Sodium: 137 mmol/L (ref 135–145)

## 2024-08-22 LAB — CBC
HCT: 37.8 % (ref 36.0–46.0)
Hemoglobin: 12.5 g/dL (ref 12.0–15.0)
MCH: 30.6 pg (ref 26.0–34.0)
MCHC: 33.1 g/dL (ref 30.0–36.0)
MCV: 92.6 fL (ref 80.0–100.0)
Platelets: 181 K/uL (ref 150–400)
RBC: 4.08 MIL/uL (ref 3.87–5.11)
RDW: 13 % (ref 11.5–15.5)
WBC: 5.6 K/uL (ref 4.0–10.5)
nRBC: 0 % (ref 0.0–0.2)

## 2024-08-22 LAB — POC SOFIA SARS ANTIGEN FIA: SARS Coronavirus 2 Ag: NEGATIVE

## 2024-08-22 LAB — TROPONIN T, HIGH SENSITIVITY: Troponin T High Sensitivity: <15 ng/L (ref 0–19)

## 2024-08-22 MED ORDER — TETANUS-DIPHTH-ACELL PERTUSSIS 5-2-15.5 LF-MCG/0.5 IM SUSP
0.5000 mL | Freq: Once | INTRAMUSCULAR | Status: AC
  Administered 2024-08-22: 0.5 mL via INTRAMUSCULAR
  Filled 2024-08-22: qty 0.5

## 2024-08-22 NOTE — ED Triage Notes (Signed)
 C/o syncope x2 on Wednesday and cough, congestion x4 days. Was seen at [facility] and told to come here. Xray was performed and they stated she had fluid in her lungs per spouse. PMH: orthostatic hypotension. GCS 15

## 2024-08-22 NOTE — ED Notes (Signed)
 Called CCMD to add pt to monitoring.

## 2024-08-22 NOTE — ED Notes (Signed)
 Pt states she has had congestion and coughing for the past 4 days. Pt's support person states pt fell twice on 08/20/24. Pt's support person states LOC for one fall but not the other, and denies hitting head. Pt reports taking Plavix . Pt's support person states they came from Urgent Care this morning because they said pt had fluid around her lungs and recommended being seen.  Pt denies pain at this time. Pt denies SOB. Pt A&Ox4.

## 2024-08-22 NOTE — Discharge Instructions (Signed)
 You were seen in the emergency department following 2 episodes of syncope.  You continue to have orthostatic hypotension while in the emergency department.  Continue to follow-up with your outpatient team and take your medications as prescribed.  Your tetanus was updated today.  Talk with your ENT specialist about vestibular physical therapy and if that would be helpful for your symptoms  Make sure when you are going to get out of a car that you rest and sit there for at least 5 minutes prior to walking inside, did not start walking without support if you are feeling lightheaded or dizzy.

## 2024-08-22 NOTE — ED Provider Notes (Signed)
 "  [facility] Provider Note    Event Date/Time   First MD Initiated Contact with Patient 08/22/24 1250     (approximate)   History   Shortness of Breath   HPI  Judy Jenkins is a 89 y.o. female past medical history significant for orthostatic hypotension, history of syncope, fibromyalgia, GERD, hyperlipidemia, history of breast cancer, HFpEF, presents to the emergency department with concern for fluid on her lungs.  States that she went to urgent care earlier today because she was having some shortness of breath.  States that she had 2 episodes of syncope on Wednesday.  States that she got up from her car and was walking inside that caused her to pass out.  States that she has a long history of orthostatic hypotension and is on multiple medications.  Including midodrine  and is followed closely as an outpatient with cardiology.  States that she went to urgent care today because she had a skin tear on her arm from falling.  States that she has had some mild cough and congestion over the past 4 days.  Denies fever or chills.  Was told by urgent care that she had fluid on her lungs and come to the emergency department.  On chart review patient had a chest x-ray that was read as no acute findings, did not note any fluid on the lungs or pulmonary edema or pleural effusions.  No signs of pneumonia.     Physical Exam   Triage Vital Signs: ED Triage Vitals  Encounter Vitals Group     BP 08/22/24 1235 (!) 170/81     Girls Systolic BP Percentile --      Girls Diastolic BP Percentile --      Boys Systolic BP Percentile --      Boys Diastolic BP Percentile --      Pulse Rate 08/22/24 1235 79     Resp 08/22/24 1235 16     Temp 08/22/24 1235 97.6 F (36.4 C)     Temp src --      SpO2 08/22/24 1235 100 %     Weight --      Height --      Head Circumference --      Peak Flow --      Pain Score 08/22/24 1236 0     Pain Loc --      Pain Education --       Exclude from Growth Chart --     Most recent vital signs: Vitals:   08/22/24 1410 08/22/24 1500  BP: 135/73 (!) 167/78  Pulse:  76  Resp: 16 19  Temp:    SpO2:  97%    Physical Exam Constitutional:      Appearance: She is well-developed.  HENT:     Head: Atraumatic.  Eyes:     Conjunctiva/sclera: Conjunctivae normal.  Cardiovascular:     Rate and Rhythm: Regular rhythm.  Pulmonary:     Effort: No respiratory distress.     Breath sounds: No wheezing or rales.  Abdominal:     General: There is no distension.  Musculoskeletal:        General: Normal range of motion.     Cervical back: Normal range of motion.     Right lower leg: No edema.     Left lower leg: No edema.     Comments: Skin tear to the right forearm that is hemostatic  Skin:    General: Skin is  warm.     Capillary Refill: Capillary refill takes less than 2 seconds.  Neurological:     Mental Status: She is alert. Mental status is at baseline.     IMPRESSION / MDM / ASSESSMENT AND PLAN / ED COURSE  I reviewed the triage vital signs and the nursing notes.  Differential diagnosis including orthostatic hypotension, anemia, viral illness including COVID/influenza, pneumonia, ACS, dysrhythmia, pulmonary edema, heart failure exacerbation  Patient's orthostatic blood pressures were positive but remained normotensive, did have some mild symptoms with standing   EKG  I, Clotilda Punter, the attending physician, personally viewed and interpreted this ECG.  EKG showed normal sinus rhythm, significant underlying artifact, nonspecific changes, QTc 457.  No significant ST elevation or depression.  No significant change when compared to prior EKG \ No tachycardic or bradycardic dysrhythmias while on cardiac telemetry.  RADIOLOGY Outside chest x-ray read as no acute findings  LABS (all labs ordered are listed, but only abnormal results are displayed) Labs interpreted as -    Labs Reviewed  BASIC METABOLIC PANEL  WITH GFR - Abnormal; Notable for the following components:      Result Value   Glucose, Bld 107 (*)    GFR, Estimated 59 (*)    All other components within normal limits  CBC  TROPONIN T, HIGH SENSITIVITY     MDM  Patient's lab work is overall reassuring patient patient with no signs of respiratory distress and lungs are clear to auscultation with no wheezing.  No findings consistent with pneumonia.  COVID influenza testing were negative.  Patient was positive for orthostatic hypotension however no further episodes of syncope and on chart review patient is followed closely as an outpatient and on multiple medications for her orthostatic hypotension.  Discussed close follow-up as an outpatient with her cardiologist and primary care physician.  On my evaluation I do not see any signs of heart failure exacerbation or pulmonary edema.  Patient resting comfortably on room air.  No signs of respiratory distress.  No significant anemia or lab work abnormality.  Discussed close follow-up as an outpatient with primary care and cardiology return for any worsening symptoms     PROCEDURES:  Critical Care performed: No  Procedures  Patient's presentation is most consistent with acute presentation with potential threat to life or bodily function.   MEDICATIONS ORDERED IN ED: Medications  Tdap (ADACEL) injection 0.5 mL (0.5 mLs Intramuscular Given 08/22/24 1412)    FINAL CLINICAL IMPRESSION(S) / ED DIAGNOSES   Final diagnoses:  Shortness of breath  Syncope, unspecified syncope type  Orthostatic hypertension     Rx / DC Orders   ED Discharge Orders     None        Note:  This document was prepared using Dragon voice recognition software and may include unintentional dictation errors.   Punter Clotilda, MD 08/22/24 1538  "

## 2024-08-22 NOTE — ED Triage Notes (Signed)
 Patient states that she passed out and fell on her deck on Wed.  Patient states that she cut her right forearm on a pice of metal.  Patient also reports a lot of coughing and chest congestion.  Patient is on a blood thinner.  Patient unsure if she hit her head.  Patient denies fevers.

## 2024-08-22 NOTE — Discharge Instructions (Signed)
-   You need to go to the ER to be checked out further for your passing out episode. - We did an x-ray and it looks like there is fluid in your lungs.  This could be related to your heart. - Do not like going to ER could result in life-threatening complications.  You have been advised to follow up immediately in the emergency department for concerning signs.symptoms. If you declined EMS transport, please have a family member take you directly to the ED at this time. Do not delay. Based on concerns about condition, if you do not follow up in th e ED, you may risk poor outcomes including worsening of condition, delayed treatment and potentially life threatening issues. If you have declined to go to the ED at this time, you should call your PCP immediately to set up a follow up appointment.  Go to ED for red flag symptoms, including; fevers you cannot reduce with Tylenol /Motrin, severe headaches, vision changes, numbness/weakness in part of the body, lethargy, confusion, intractable vomiting, severe dehydration, chest pain, breathing difficulty, severe persistent abdominal or pelvic pain, signs of severe infection (increased redness, swelling of an area), feeling faint or passing out, dizziness, etc. You should especially go to the ED for sudden acute worsening of condition if you do not elect to go at this time.

## 2024-08-22 NOTE — ED Notes (Signed)
 Patient is being discharged from the Urgent Care and sent to the Emergency Department via POV . Per Lyle Host, PA, patient is in need of higher level of care due to Syncope. Patient is aware and verbalizes understanding of plan of care.  Vitals:   08/22/24 1047  BP: (!) 155/77  Pulse: 79  Resp: 14  Temp: 97.6 F (36.4 C)  SpO2: 96%

## 2024-08-22 NOTE — ED Provider Notes (Signed)
 " [facility]    CSN: [id number] Arrival date & time: 08/22/24  1015      History   Chief Complaint Chief Complaint  Patient presents with   Fall   Near Syncope   Extremity Laceration    Right forearm    HPI Judy Jenkins is a 89 y.o. female presenting with her husband for multiple complaints.  First she reports a syncopal episode that occurred 2 days ago.  States she got out of the car and as she was getting inside she fell and lost consciousness for a few seconds.  Denies head injury or head wound.  She did sustain a laceration/skin tear of the right forearm.  Cut it on a piece of metal.  She says she is up-to-date with tetanus immunization.  She reports being on Plavix .  Has been cleaning the area and kept it bandaged.  She is unsure if she might need stitches.  She denies headache, dizziness, weakness, chest pain at this time.  For the past 4 days or so patient has had cough, congestion and fatigue.  Reports temps up to 99 degrees.  Also reports wheezing but denies any shortness of breath, chest pain, abdominal pain, leg swelling, vomiting or diarrhea.  No known COVID or flu exposure.  Has not been taking any cough medication.  No history of asthma or COPD and patient is a non-smoker.  Her medical history is significant for static hypotension, syncope, inappropriate sinus tachycardia, palpitations, CVA, hyperlipidemia.  Patient is being followed by cardiology for syncope.  She has notified them of her recent fall.  HPI  Past Medical History:  Diagnosis Date   Cancer (HCC)    Tachycardia     Patient Active Problem List   Diagnosis Date Noted   Hyperlipidemia 04/21/2024   Recurrent syncope 11/25/2023   SAH (subarachnoid hemorrhage) (HCC) 11/24/2023   Rib pain on left side 03/15/2023   Muscle strain 03/15/2023   Positive D dimer 02/13/2022   Grade I diastolic dysfunction, LVH 02/13/2022   Dizziness 02/12/2022   Pain in limb 12/22/2021   Swelling of limb  12/22/2021   Breast cancer (HCC) 12/22/2021   Orthostatic hypotension 02/03/2019   Acquired hypothyroidism 04/26/2011   Anxiety 04/26/2011   GERD (gastroesophageal reflux disease) w/ large hiatal hernia  04/26/2011    Past Surgical History:  Procedure Laterality Date   ABDOMINAL HYSTERECTOMY     BREAST SURGERY     CHOLECYSTECTOMY      OB History   No obstetric history on file.      Home Medications    Prior to Admission medications  Medication Sig Start Date End Date Taking? Authorizing Provider  amitriptyline  (ELAVIL ) 10 MG tablet Take 10 mg by mouth at bedtime.    [provider]  aspirin  EC 81 MG tablet Take 1 tablet (81 mg total) by mouth daily. 02/14/22   Alexander, Natalie, DO  Biotin 1 MG CAPS Take by mouth.    [provider]  Cholecalciferol 25 MCG (1000 UT) capsule Take 1,000 Units by mouth daily.    [provider]  [Paused] clopidogrel  (PLAVIX ) 75 MG tablet Take 75 mg by mouth daily. Wait to take this until your doctor or other care provider tells you to start again.    [provider]  Droxidopa  200 MG CAPS Take 200 mg by mouth 3 (three) times daily with meals. 08/07/24     ipratropium (ATROVENT ) 0.06 % nasal spray Place 2 sprays into both  nostrils 4 (four) times daily. 11/04/22   Arvis Jolan NOVAK, PA-C  letrozole  (FEMARA ) 2.5 MG tablet Take 2.5 mg by mouth daily. 09/16/21   [provider]  levothyroxine  (SYNTHROID ) 75 MCG tablet Take 75 mcg by mouth daily. 01/30/22   [provider]  lidocaine  (XYLOCAINE ) 2 % solution Use as directed 15 mLs in the mouth or throat every 3 (three) hours as needed for mouth pain (swish and spit). 11/04/22   Arvis Jolan NOVAK, PA-C  metoprolol  succinate (TOPROL -XL) 25 MG 24 hr tablet Take 25 mg by mouth daily.    [provider]  omeprazole  (PRILOSEC  OTC) 20 MG tablet Prilosec  20 mg capsule,delayed release  Take 1 capsule every day by oral route.    [provider]   rosuvastatin  (CRESTOR ) 10 MG tablet Take 1 tablet (10 mg total) by mouth daily. 02/13/22   Alexander, Natalie, DO  Vitamin E 268 MG (400 UNIT) CAPS Take 400 Units by mouth daily.    [provider]    Family History Family History  Problem Relation Age of Onset   CAD Mother    CAD Father     Social History Social History[1]   Allergies   Avelox [moxifloxacin hcl in nacl], Ciprofloxacin, Biaxin [clarithromycin], Atorvastatin , Codeine, and Sulfa antibiotics   Review of Systems Review of Systems  Constitutional:  Positive for fatigue. Negative for chills, diaphoresis and fever.  HENT:  Positive for congestion. Negative for ear pain, rhinorrhea, sinus pain and sore throat.   Eyes:  Negative for visual disturbance.  Respiratory:  Positive for cough and wheezing. Negative for shortness of breath.   Cardiovascular:  Negative for chest pain and palpitations.  Gastrointestinal:  Negative for abdominal pain, nausea and vomiting.  Musculoskeletal:  Negative for arthralgias and myalgias.  Skin:  Positive for color change and wound. Negative for rash.  Neurological:  Positive for syncope. Negative for dizziness, weakness, numbness and headaches.  Hematological:  Negative for adenopathy.     Physical Exam Triage Vital Signs ED Triage Vitals  Encounter Vitals Group     BP 08/22/24 1047 (!) 155/77     Girls Systolic BP Percentile --      Girls Diastolic BP Percentile --      Boys Systolic BP Percentile --      Boys Diastolic BP Percentile --      Pulse Rate 08/22/24 1047 79     Resp 08/22/24 1047 14     Temp 08/22/24 1047 97.6 F (36.4 C)     Temp Source 08/22/24 1047 Oral     SpO2 08/22/24 1047 96 %     Weight 08/22/24 1045 119 lb 4.3 oz (54.1 kg)     Height 08/22/24 1045 5' 7 (1.702 m)     Head Circumference --      Peak Flow --      Pain Score 08/22/24 1044 2     Pain Loc --      Pain Education --      Exclude from Growth Chart --    No data found.  Updated  Vital Signs BP (!) 155/77 (BP Location: Left Arm)   Pulse 79   Temp 97.6 F (36.4 C) (Oral)   Resp 14   Ht 5' 7 (1.702 m)   Wt 119 lb 4.3 oz (54.1 kg)   SpO2 96%   BMI 18.68 kg/m    Physical Exam Vitals and nursing note reviewed.  Constitutional:      General: She  is not in acute distress.    Appearance: Normal appearance. She is not ill-appearing or toxic-appearing.  HENT:     Head: Normocephalic and atraumatic.     Right Ear: Tympanic membrane, ear canal and external ear normal.     Left Ear: Tympanic membrane, ear canal and external ear normal.     Nose: Congestion present.     Mouth/Throat:     Mouth: Mucous membranes are moist.     Pharynx: Oropharynx is clear.  Eyes:     General: No scleral icterus.       Right eye: No discharge.        Left eye: No discharge.     Conjunctiva/sclera: Conjunctivae normal.  Cardiovascular:     Rate and Rhythm: Normal rate and regular rhythm.     Heart sounds: Normal heart sounds.  Pulmonary:     Effort: Pulmonary effort is normal. No respiratory distress.     Breath sounds: Wheezing (scattered) present.  Musculoskeletal:     Cervical back: Neck supple.     Right lower leg: No edema.     Left lower leg: No edema.  Skin:    General: Skin is dry.     Comments: Large skin tear of right dorsal forearm with surrounding contusion. No bony tenderness.  Neurological:     General: No focal deficit present.     Mental Status: She is alert. Mental status is at baseline.     Motor: No weakness.     Gait: Gait normal.  Psychiatric:        Mood and Affect: Mood normal.        Behavior: Behavior normal.      UC Treatments / Results  Labs (all labs ordered are listed, but only abnormal results are displayed) Labs Reviewed  POC SOFIA SARS ANTIGEN FIA - Normal    EKG   Radiology DG Chest 2 View Result Date: 08/22/2024 CLINICAL DATA:  Cough and congestion. EXAM: CHEST - 2 VIEW COMPARISON:  03/15/2023. FINDINGS: The heart size and  mediastinal contours are within normal limits. Moderate-sized hiatal hernia again noted. Aortic atherosclerosis. Chronic hyperinflation with similar mild left basilar scarring. No focal consolidation, pleural effusion, or pneumothorax. Surgical clips in the right breast region. Diffuse osseous demineralization. Remote healed left-sided rib fractures. No acute osseous abnormality. IMPRESSION: 1. No acute cardiopulmonary findings. 2. Hyperinflation. 3. Moderate-sized hiatal hernia. Electronically Signed   By: Harrietta Sherry M.D.   On: 08/22/2024 12:46    Procedures ED EKG  Date/Time: 08/22/2024 11:44 AM  Performed by: Arvis Jolan NOVAK, PA-C Authorized by: Arvis Jolan NOVAK, PA-C   Interpretation:    Interpretation: abnormal   Rate:    ECG rate:  79   ECG rate assessment: normal   Rhythm:    Rhythm: sinus rhythm   Ectopy:    Ectopy: none   QRS:    QRS axis:  Normal   QRS intervals:  Normal   QRS conduction: normal   Comments:     Normal sinus rhythm with regular rate.  Age undetermined septic infarct.  This compared to previous EKG from April 2025 without any significant changes.  (including critical care time)  Medications Ordered in UC Medications - No data to display  Initial Impression / Assessment and Plan / UC Course  I have reviewed the triage vital signs and the nursing notes.  Pertinent labs & imaging results that were available during my care of the patient were reviewed by me and  considered in my medical decision making (see chart for details).   89 year old female presents for 2 complaints.  Reports syncopal episode, fall and right arm laceration/skin tear that occurred 2 days ago.  Denies head injury and has not had any headaches, dizziness, weakness, chest pain, leg swelling, shortness of breath.  Has had cough, congestion, fatigue and wheezing x 4 days.  Current blood pressure 155/177.  She is being followed by cardiology for orthostatic hypotension and syncope.  Other  vitals are normal and stable.  She is overall well-appearing.  No acute distress.  On exam has large skin tear of the right forearm.  See image included in chart.  Mild bleeding noted.  Wound cleansed by me.  Applied bacitracin, nonstick pad and Coban.  No bony tenderness so we will hold off on x-ray.  She states she is up-to-date with tetanus immunization.  She does have wheezing on auscultation of lungs.  No swelling of lower extremities.  Will obtain chest x-ray.  EKG performed today shows normal sinus rhythm without any significant changes when compared to previous.  Chest x-ray concerning for possible pleural effusion.  Discussed my concerns with patient.  Advised ED evaluation for syncope, possible lower respiratory tract infection versus CHF given wheezing and possible pleural effusions on x-ray.  This patient needs higher level of care likely to include labs, possible CT scan, cardiac monitoring.  Patient declines EMS transport.  Husband will take her to [facility]  Leaving in stable condition.  Condition with potential threat to life and bodily harm.   Final Clinical Impressions(s) / UC Diagnoses   Final diagnoses:  Acute cough  Syncope, unspecified syncope type  Wheezing  Skin tear of right forearm without complication, initial encounter     Discharge Instructions      - You need to go to the ER to be checked out further for your passing out episode. - We did an x-ray and it looks like there is fluid in your lungs.  This could be related to your heart. - Do not like going to ER could result in life-threatening complications.  You have been advised to follow up immediately in the emergency department for concerning signs.symptoms. If you declined EMS transport, please have a family member take you directly to the ED at this time. Do not delay. Based on concerns about condition, if you do not follow up in th e ED, you may risk poor outcomes including worsening of condition, delayed  treatment and potentially life threatening issues. If you have declined to go to the ED at this time, you should call your PCP immediately to set up a follow up appointment.  Go to ED for red flag symptoms, including; fevers you cannot reduce with Tylenol /Motrin, severe headaches, vision changes, numbness/weakness in part of the body, lethargy, confusion, intractable vomiting, severe dehydration, chest pain, breathing difficulty, severe persistent abdominal or pelvic pain, signs of severe infection (increased redness, swelling of an area), feeling faint or passing out, dizziness, etc. You should especially go to the ED for sudden acute worsening of condition if you do not elect to go at this time.      ED Prescriptions   None    PDMP not reviewed this encounter.     [1]  Social History Tobacco Use   Smoking status: Never   Smokeless tobacco: Never  Vaping Use   Vaping status: Never Used  Substance Use Topics   Alcohol use: No   Drug use: No  Arvis Jolan NOVAK, PA-C 08/23/24 1102  "

## 2024-08-25 ENCOUNTER — Ambulatory Visit (HOSPITAL_COMMUNITY): Payer: Self-pay

## 2024-09-23 ENCOUNTER — Other Ambulatory Visit: Payer: Self-pay

## 2024-09-24 ENCOUNTER — Other Ambulatory Visit: Payer: Self-pay
